# Patient Record
Sex: Male | Born: 1965 | Hispanic: No | Smoking: Never smoker
Health system: Southern US, Community
[De-identification: ages and names within clinical notes are randomized; demographics above are authoritative.]

## PROBLEM LIST (undated history)

## (undated) DIAGNOSIS — N289 Disorder of kidney and ureter, unspecified: Secondary | ICD-10-CM

## (undated) DIAGNOSIS — E119 Type 2 diabetes mellitus without complications: Secondary | ICD-10-CM

## (undated) DIAGNOSIS — E079 Disorder of thyroid, unspecified: Secondary | ICD-10-CM

## (undated) DIAGNOSIS — I509 Heart failure, unspecified: Secondary | ICD-10-CM

## (undated) DIAGNOSIS — Z5189 Encounter for other specified aftercare: Secondary | ICD-10-CM

## (undated) DIAGNOSIS — I10 Essential (primary) hypertension: Secondary | ICD-10-CM

## (undated) DIAGNOSIS — C801 Malignant (primary) neoplasm, unspecified: Secondary | ICD-10-CM

## (undated) HISTORY — PX: ABDOMINAL SURGERY: SHX537

## (undated) HISTORY — PX: EYE SURGERY: SHX253

## (undated) HISTORY — PX: COLON SURGERY: SHX602

---

## 2020-12-14 ENCOUNTER — Inpatient Hospital Stay (HOSPITAL_COMMUNITY)
Admission: EM | Admit: 2020-12-14 | Discharge: 2020-12-17 | DRG: 312 | Disposition: A | Discharge status: Home Health | Payer: Medicare Other | Attending: Internal Medicine | Admitting: Internal Medicine

## 2020-12-14 ENCOUNTER — Emergency Department (HOSPITAL_COMMUNITY): Payer: Medicare Other

## 2020-12-14 ENCOUNTER — Other Ambulatory Visit: Payer: Self-pay

## 2020-12-14 ENCOUNTER — Encounter (HOSPITAL_COMMUNITY): Payer: Self-pay

## 2020-12-14 DIAGNOSIS — E876 Hypokalemia: Secondary | ICD-10-CM | POA: Diagnosis present

## 2020-12-14 DIAGNOSIS — R200 Anesthesia of skin: Secondary | ICD-10-CM

## 2020-12-14 DIAGNOSIS — Z7982 Long term (current) use of aspirin: Secondary | ICD-10-CM

## 2020-12-14 DIAGNOSIS — Z20822 Contact with and (suspected) exposure to covid-19: Secondary | ICD-10-CM | POA: Diagnosis present

## 2020-12-14 DIAGNOSIS — E1140 Type 2 diabetes mellitus with diabetic neuropathy, unspecified: Secondary | ICD-10-CM | POA: Diagnosis present

## 2020-12-14 DIAGNOSIS — I2510 Atherosclerotic heart disease of native coronary artery without angina pectoris: Secondary | ICD-10-CM | POA: Diagnosis present

## 2020-12-14 DIAGNOSIS — E039 Hypothyroidism, unspecified: Secondary | ICD-10-CM | POA: Diagnosis present

## 2020-12-14 DIAGNOSIS — I130 Hypertensive heart and chronic kidney disease with heart failure and stage 1 through stage 4 chronic kidney disease, or unspecified chronic kidney disease: Secondary | ICD-10-CM | POA: Diagnosis present

## 2020-12-14 DIAGNOSIS — E781 Pure hyperglyceridemia: Secondary | ICD-10-CM | POA: Diagnosis present

## 2020-12-14 DIAGNOSIS — N179 Acute kidney failure, unspecified: Secondary | ICD-10-CM

## 2020-12-14 DIAGNOSIS — E1165 Type 2 diabetes mellitus with hyperglycemia: Secondary | ICD-10-CM | POA: Diagnosis present

## 2020-12-14 DIAGNOSIS — E861 Hypovolemia: Secondary | ICD-10-CM | POA: Diagnosis present

## 2020-12-14 DIAGNOSIS — Z955 Presence of coronary angioplasty implant and graft: Secondary | ICD-10-CM

## 2020-12-14 DIAGNOSIS — R251 Tremor, unspecified: Secondary | ICD-10-CM | POA: Diagnosis present

## 2020-12-14 DIAGNOSIS — E1122 Type 2 diabetes mellitus with diabetic chronic kidney disease: Secondary | ICD-10-CM | POA: Diagnosis present

## 2020-12-14 DIAGNOSIS — G459 Transient cerebral ischemic attack, unspecified: Secondary | ICD-10-CM

## 2020-12-14 DIAGNOSIS — I5022 Chronic systolic (congestive) heart failure: Secondary | ICD-10-CM

## 2020-12-14 DIAGNOSIS — Z794 Long term (current) use of insulin: Secondary | ICD-10-CM | POA: Diagnosis not present

## 2020-12-14 DIAGNOSIS — E119 Type 2 diabetes mellitus without complications: Secondary | ICD-10-CM

## 2020-12-14 DIAGNOSIS — K219 Gastro-esophageal reflux disease without esophagitis: Secondary | ICD-10-CM | POA: Diagnosis present

## 2020-12-14 DIAGNOSIS — G8194 Hemiplegia, unspecified affecting left nondominant side: Secondary | ICD-10-CM | POA: Diagnosis present

## 2020-12-14 DIAGNOSIS — Z7902 Long term (current) use of antithrombotics/antiplatelets: Secondary | ICD-10-CM

## 2020-12-14 DIAGNOSIS — R278 Other lack of coordination: Secondary | ICD-10-CM | POA: Diagnosis present

## 2020-12-14 DIAGNOSIS — Z85038 Personal history of other malignant neoplasm of large intestine: Secondary | ICD-10-CM

## 2020-12-14 DIAGNOSIS — D509 Iron deficiency anemia, unspecified: Secondary | ICD-10-CM | POA: Diagnosis present

## 2020-12-14 DIAGNOSIS — E785 Hyperlipidemia, unspecified: Secondary | ICD-10-CM | POA: Diagnosis present

## 2020-12-14 DIAGNOSIS — N183 Chronic kidney disease, stage 3 unspecified: Secondary | ICD-10-CM

## 2020-12-14 DIAGNOSIS — D631 Anemia in chronic kidney disease: Secondary | ICD-10-CM | POA: Diagnosis present

## 2020-12-14 DIAGNOSIS — I951 Orthostatic hypotension: Secondary | ICD-10-CM | POA: Diagnosis present

## 2020-12-14 DIAGNOSIS — G959 Disease of spinal cord, unspecified: Secondary | ICD-10-CM | POA: Diagnosis present

## 2020-12-14 DIAGNOSIS — Z79899 Other long term (current) drug therapy: Secondary | ICD-10-CM

## 2020-12-14 DIAGNOSIS — Z79890 Hormone replacement therapy (postmenopausal): Secondary | ICD-10-CM

## 2020-12-14 DIAGNOSIS — Z8249 Family history of ischemic heart disease and other diseases of the circulatory system: Secondary | ICD-10-CM

## 2020-12-14 DIAGNOSIS — I6389 Other cerebral infarction: Secondary | ICD-10-CM | POA: Diagnosis not present

## 2020-12-14 DIAGNOSIS — R338 Other retention of urine: Secondary | ICD-10-CM | POA: Diagnosis present

## 2020-12-14 DIAGNOSIS — R42 Dizziness and giddiness: Secondary | ICD-10-CM | POA: Diagnosis not present

## 2020-12-14 DIAGNOSIS — N1832 Chronic kidney disease, stage 3b: Secondary | ICD-10-CM | POA: Diagnosis not present

## 2020-12-14 DIAGNOSIS — N401 Enlarged prostate with lower urinary tract symptoms: Secondary | ICD-10-CM | POA: Diagnosis present

## 2020-12-14 DIAGNOSIS — D500 Iron deficiency anemia secondary to blood loss (chronic): Secondary | ICD-10-CM

## 2020-12-14 DIAGNOSIS — I639 Cerebral infarction, unspecified: Secondary | ICD-10-CM | POA: Diagnosis not present

## 2020-12-14 DIAGNOSIS — I255 Ischemic cardiomyopathy: Secondary | ICD-10-CM | POA: Diagnosis present

## 2020-12-14 DIAGNOSIS — N184 Chronic kidney disease, stage 4 (severe): Secondary | ICD-10-CM | POA: Diagnosis present

## 2020-12-14 DIAGNOSIS — I10 Essential (primary) hypertension: Secondary | ICD-10-CM

## 2020-12-14 DIAGNOSIS — Z833 Family history of diabetes mellitus: Secondary | ICD-10-CM

## 2020-12-14 DIAGNOSIS — Z9221 Personal history of antineoplastic chemotherapy: Secondary | ICD-10-CM

## 2020-12-14 HISTORY — DX: Disorder of thyroid, unspecified: E07.9

## 2020-12-14 HISTORY — DX: Essential (primary) hypertension: I10

## 2020-12-14 HISTORY — DX: Heart failure, unspecified: I50.9

## 2020-12-14 HISTORY — DX: Malignant (primary) neoplasm, unspecified: C80.1

## 2020-12-14 HISTORY — DX: Disorder of kidney and ureter, unspecified: N28.9

## 2020-12-14 HISTORY — DX: Type 2 diabetes mellitus without complications: E11.9

## 2020-12-14 HISTORY — DX: Encounter for other specified aftercare: Z51.89

## 2020-12-14 LAB — DIFFERENTIAL
Abs Immature Granulocytes: 0.05 10*3/uL (ref 0.00–0.07)
Basophils Absolute: 0 10*3/uL (ref 0.0–0.1)
Basophils Relative: 0 %
Eosinophils Absolute: 0.1 10*3/uL (ref 0.0–0.5)
Eosinophils Relative: 1 %
Immature Granulocytes: 1 %
Lymphocytes Relative: 8 %
Lymphs Abs: 0.8 10*3/uL (ref 0.7–4.0)
Monocytes Absolute: 0.8 10*3/uL (ref 0.1–1.0)
Monocytes Relative: 8 %
Neutro Abs: 8.3 10*3/uL — ABNORMAL HIGH (ref 1.7–7.7)
Neutrophils Relative %: 82 %

## 2020-12-14 LAB — I-STAT CHEM 8, ED
BUN: 36 mg/dL — ABNORMAL HIGH (ref 6–20)
Calcium, Ion: 1.14 mmol/L — ABNORMAL LOW (ref 1.15–1.40)
Chloride: 93 mmol/L — ABNORMAL LOW (ref 98–111)
Creatinine, Ser: 3.1 mg/dL — ABNORMAL HIGH (ref 0.61–1.24)
Glucose, Bld: 229 mg/dL — ABNORMAL HIGH (ref 70–99)
HCT: 25 % — ABNORMAL LOW (ref 39.0–52.0)
Hemoglobin: 8.5 g/dL — ABNORMAL LOW (ref 13.0–17.0)
Potassium: 3.9 mmol/L (ref 3.5–5.1)
Sodium: 134 mmol/L — ABNORMAL LOW (ref 135–145)
TCO2: 28 mmol/L (ref 22–32)

## 2020-12-14 LAB — COMPREHENSIVE METABOLIC PANEL
ALT: 15 U/L (ref 0–44)
AST: 16 U/L (ref 15–41)
Albumin: 2.7 g/dL — ABNORMAL LOW (ref 3.5–5.0)
Alkaline Phosphatase: 36 U/L — ABNORMAL LOW (ref 38–126)
Anion gap: 10 (ref 5–15)
BUN: 37 mg/dL — ABNORMAL HIGH (ref 6–20)
CO2: 27 mmol/L (ref 22–32)
Calcium: 8.6 mg/dL — ABNORMAL LOW (ref 8.9–10.3)
Chloride: 96 mmol/L — ABNORMAL LOW (ref 98–111)
Creatinine, Ser: 3.23 mg/dL — ABNORMAL HIGH (ref 0.61–1.24)
GFR, Estimated: 22 mL/min — ABNORMAL LOW (ref >60)
Glucose, Bld: 228 mg/dL — ABNORMAL HIGH (ref 70–99)
Potassium: 4 mmol/L (ref 3.5–5.1)
Sodium: 133 mmol/L — ABNORMAL LOW (ref 135–145)
Total Bilirubin: 0.4 mg/dL (ref 0.3–1.2)
Total Protein: 5.6 g/dL — ABNORMAL LOW (ref 6.5–8.1)

## 2020-12-14 LAB — RETICULOCYTES
Immature Retic Fract: 9 % (ref 2.3–15.9)
RBC.: 2.63 MIL/uL — ABNORMAL LOW (ref 4.22–5.81)
Retic Count, Absolute: 69.4 10*3/uL (ref 19.0–186.0)
Retic Ct Pct: 2.6 % (ref 0.4–3.1)

## 2020-12-14 LAB — CBC
HCT: 25 % — ABNORMAL LOW (ref 39.0–52.0)
Hemoglobin: 7.9 g/dL — ABNORMAL LOW (ref 13.0–17.0)
MCH: 29.4 pg (ref 26.0–34.0)
MCHC: 31.6 g/dL (ref 30.0–36.0)
MCV: 92.9 fL (ref 80.0–100.0)
Platelets: 264 10*3/uL (ref 150–400)
RBC: 2.69 MIL/uL — ABNORMAL LOW (ref 4.22–5.81)
RDW: 13.2 % (ref 11.5–15.5)
WBC: 10.1 10*3/uL (ref 4.0–10.5)
nRBC: 0 % (ref 0.0–0.2)

## 2020-12-14 LAB — TSH: TSH: 3.313 u[IU]/mL (ref 0.350–4.500)

## 2020-12-14 LAB — IRON AND TIBC
Iron: 30 ug/dL — ABNORMAL LOW (ref 45–182)
Saturation Ratios: 12 % — ABNORMAL LOW (ref 17.9–39.5)
TIBC: 248 ug/dL — ABNORMAL LOW (ref 250–450)
UIBC: 218 ug/dL

## 2020-12-14 LAB — HIV ANTIBODY (ROUTINE TESTING W REFLEX): HIV Screen 4th Generation wRfx: NONREACTIVE

## 2020-12-14 LAB — CBG MONITORING, ED
Glucose-Capillary: 201 mg/dL — ABNORMAL HIGH (ref 70–99)
Glucose-Capillary: 213 mg/dL — ABNORMAL HIGH (ref 70–99)
Glucose-Capillary: 138 mg/dL — ABNORMAL HIGH (ref 70–99)

## 2020-12-14 LAB — PROTIME-INR
INR: 1.2 (ref 0.8–1.2)
Prothrombin Time: 15.1 seconds (ref 11.4–15.2)

## 2020-12-14 LAB — DIGOXIN LEVEL: Digoxin Level: 2.2 ng/mL — ABNORMAL HIGH (ref 0.8–2.0)

## 2020-12-14 LAB — AMMONIA: Ammonia: 15 umol/L (ref 9–35)

## 2020-12-14 LAB — FERRITIN: Ferritin: 74 ng/mL (ref 24–336)

## 2020-12-14 LAB — APTT: aPTT: 32 seconds (ref 24–36)

## 2020-12-14 MED ORDER — CLOPIDOGREL BISULFATE 75 MG PO TABS
75.0000 mg | ORAL_TABLET | Freq: Every day | ORAL | Status: DC
  Administered 2020-12-14: 75 mg via ORAL
  Filled 2020-12-14: qty 1

## 2020-12-14 MED ORDER — HYDRALAZINE HCL 25 MG PO TABS: 25.0000 mg | ORAL_TABLET | Freq: Four times a day (QID) | ORAL | Status: DC | PRN

## 2020-12-14 MED ORDER — SODIUM CHLORIDE 0.9 % IV SOLN: INTRAVENOUS | Status: DC

## 2020-12-14 MED ORDER — ACETAMINOPHEN 650 MG RE SUPP: 650.0000 mg | RECTAL | Status: DC | PRN

## 2020-12-14 MED ORDER — INSULIN ASPART 100 UNIT/ML ~~LOC~~ SOLN
0.0000 [IU] | Freq: Three times a day (TID) | SUBCUTANEOUS | Status: DC
  Administered 2020-12-14: 3 [IU] via SUBCUTANEOUS

## 2020-12-14 MED ORDER — STROKE: EARLY STAGES OF RECOVERY BOOK
Freq: Once | Status: AC
  Filled 2020-12-14: qty 1

## 2020-12-14 MED ORDER — HEPARIN SODIUM (PORCINE) 5000 UNIT/ML IJ SOLN
5000.0000 [IU] | Freq: Three times a day (TID) | INTRAMUSCULAR | Status: DC
  Administered 2020-12-14 – 2020-12-17 (×7): 5000 [IU] via SUBCUTANEOUS
  Filled 2020-12-14 (×8): qty 1

## 2020-12-14 MED ORDER — TAMSULOSIN HCL 0.4 MG PO CAPS
0.4000 mg | ORAL_CAPSULE | Freq: Every day | ORAL | Status: DC
  Filled 2020-12-14: qty 1

## 2020-12-14 MED ORDER — ACETAMINOPHEN 160 MG/5ML PO SOLN: 650.0000 mg | ORAL | Status: DC | PRN

## 2020-12-14 MED ORDER — ACETAMINOPHEN 325 MG PO TABS: 650.0000 mg | ORAL_TABLET | ORAL | Status: DC | PRN

## 2020-12-14 MED ORDER — LEVOTHYROXINE SODIUM 88 MCG PO TABS
88.0000 ug | ORAL_TABLET | Freq: Every day | ORAL | Status: DC
  Administered 2020-12-15: 88 ug via ORAL
  Filled 2020-12-14: qty 1

## 2020-12-14 MED ORDER — ASPIRIN EC 81 MG PO TBEC
81.0000 mg | DELAYED_RELEASE_TABLET | Freq: Every day | ORAL | Status: DC
  Administered 2020-12-14: 81 mg via ORAL
  Filled 2020-12-14: qty 1

## 2020-12-14 MED ORDER — SENNOSIDES-DOCUSATE SODIUM 8.6-50 MG PO TABS: 1.0000 | ORAL_TABLET | Freq: Every evening | ORAL | Status: DC | PRN

## 2020-12-14 MED ORDER — ATORVASTATIN CALCIUM 80 MG PO TABS
80.0000 mg | ORAL_TABLET | Freq: Every day | ORAL | Status: DC
  Administered 2020-12-14: 80 mg via ORAL
  Filled 2020-12-14: qty 1

## 2020-12-14 MED ORDER — SODIUM CHLORIDE 0.9% FLUSH
3.0000 mL | Freq: Once | INTRAVENOUS | Status: AC
  Administered 2020-12-14: 3 mL via INTRAVENOUS

## 2020-12-14 MED ORDER — INSULIN ASPART 100 UNIT/ML ~~LOC~~ SOLN: 3.0000 [IU] | Freq: Three times a day (TID) | SUBCUTANEOUS | Status: DC

## 2020-12-14 MED ORDER — FERROUS FUMARATE 324 (106 FE) MG PO TABS
1.0000 | ORAL_TABLET | Freq: Every day | ORAL | Status: DC
  Administered 2020-12-15 – 2020-12-17 (×3): 106 mg via ORAL
  Filled 2020-12-14 (×3): qty 1

## 2020-12-14 MED ORDER — PREGABALIN 25 MG PO CAPS: 25.0000 mg | ORAL_CAPSULE | Freq: Every day | ORAL | Status: DC

## 2020-12-14 NOTE — ED Triage Notes (Signed)
Pt BIB [facility] as Code Stroke the patient reoprts her went to urology appointment and returned home at 12pm [facility]) & when he woke up from a nap at 2pm he could not stand, was weak & 3 family members confirmed his speech sounded "off." He has a baseline of neuropathy & tremors with a Hx of cardiac stents, CHF & type II diabetes. Reports he has been out of insulin for about a week, CBG upon arrival was 317. V/S per EMS 106/60, 62 bpm, 16 resp, 97% on RA, 18g PIV in LtAc. A/Ox4 upon arrival to ED, slurred speech.

## 2020-12-14 NOTE — ED Notes (Signed)
Pt states that he feels like he just jumped off the bed.

## 2020-12-14 NOTE — Consult Note (Addendum)
[facility]  INPATIENT CONSULTATION  Reason for Consultation: AKI/CKD Requesting Provider: Dr. Sherry Ruffing at Dr. Montez Hageman request  HPI: [facility] is an 55 y.o. male with CAD h/o PCI, HFrEF 45%, CKD stage 4, HTN, DM, anemia, BPH, h/o colon cancer s/p embolectomy and chemotherapy in 2015,  who is seen at the request of Dr. Sherry Ruffing for elevated creatinine.   Pt from [facility] in [place] recently.  Admitted to [facility] early 11/2020 with weakness, N/V, urinary retention with AKI requiring foley placement.  Cr initially 4 and improved to 2.4-2.5 with fluids and foley.  Appears nearly 1 year ago in [place] Cr was in the 4s.  He sees a nephrologist [facility] and has had vascular surgery consult for AV access for HD but doesn't have it yet.    He was apparently admitted to [facility] last week due to uncontrolled DM.  He came to [facility] today via EMS with R arm weakness and slurred speech after a urology appt he had to have foley removed.  Neurology evaluated and MRI/A being obtained.  CT was normal initially.  He says he hasn't voided yet since foley out but has been having urinary incontinence and wearing diapers for several weeks prior to the 11/2020 admission.     Today Cr 3.2 on presentation. K 4, bicarb 27. Albumin 2.7.  Hb 7.9.  He's on NS 75/hr currently.  He says he's been cleared to eat/drink already. He has altered taste since chemo in 2015 which hasn't changed recently.   PMH: Past Medical History:  Diagnosis Date  . Blood transfusion without reported diagnosis   . Cancer ([place])   . CHF (congestive heart failure) ([place])   . Diabetes mellitus without complication ([place])   . Hypertension   . Renal disorder   . Thyroid disease    PSH: Past Surgical History:  Procedure Laterality Date  . ABDOMINAL SURGERY    . COLON SURGERY    . EYE SURGERY      Past Medical History:  Diagnosis Date  . Blood transfusion without reported diagnosis   . Cancer ([place])   . CHF (congestive heart failure) ([place])    . Diabetes mellitus without complication ([place])   . Hypertension   . Renal disorder   . Thyroid disease     Medications:  I have reviewed the patient's current medications.  (Not in a hospital admission)   ALLERGIES:  Not on File  FAM HX: Family History  Problem Relation Age of Onset  . Cancer Mother   . Diabetes Mother   . Heart failure Mother   . Heart failure Father   . Osteoarthritis Father   . Healthy Sister   . Healthy Brother     Social History:   reports that he has never smoked. He has never used smokeless tobacco. He reports that he does not drink alcohol and does not use drugs.  ROS: 12 system ROS per HPI   Blood pressure 127/72, pulse 66, resp. rate 11, height 5\' 8"  (1.727 m), weight 84.8 kg, SpO2 98 %. PHYSICAL EXAM: Gen: chronically ill but nontoxic appearing  Eyes: EOMI grossly intact, anicteric ENT: MM tacky Neck: supple CV: RRR, no rub Abd: soft, nontender Lungs: clear GU: no foley, bladder not palpable Extr: 2+ edema to mid tibia Neuro: grossly nonfocal, conversant and oriented Skin: cool and dry   Results for orders placed or performed during the hospital encounter of 12/14/20 (from the past 48 hour(s))  CBG monitoring, ED  Status: Abnormal   Collection Time: 12/14/20  2:59 PM  Result Value Ref Range   Glucose-Capillary 201 (H) 70 - 99 mg/dL    Comment: Glucose reference range applies only to samples taken after fasting for at least 8 hours.  Protime-INR     Status: None   Collection Time: 12/14/20  3:02 PM  Result Value Ref Range   Prothrombin Time 15.1 11.4 - 15.2 seconds   INR 1.2 0.8 - 1.2    Comment: (NOTE) INR goal varies based on device and disease states. Performed at [facility], [place] [address] [place], [place] [address]   APTT     Status: None   Collection Time: 12/14/20  3:02 PM  Result Value Ref Range   aPTT 32 24 - 36 seconds    Comment: Performed at [facility] [address] [place], [place]  [address]  CBC     Status: Abnormal   Collection Time: 12/14/20  3:02 PM  Result Value Ref Range   WBC 10.1 4.0 - 10.5 K/uL   RBC 2.69 (L) 4.22 - 5.81 MIL/uL   Hemoglobin 7.9 (L) 13.0 - 17.0 g/dL   HCT 25.0 (L) 39.0 - 52.0 %   MCV 92.9 80.0 - 100.0 fL   MCH 29.4 26.0 - 34.0 pg   MCHC 31.6 30.0 - 36.0 g/dL   RDW 13.2 11.5 - 15.5 %   Platelets 264 150 - 400 K/uL   nRBC 0.0 0.0 - 0.2 %    Comment: Performed at [facility], [place] [address], [place], [place] [address]  Differential     Status: Abnormal   Collection Time: 12/14/20  3:02 PM  Result Value Ref Range   Neutrophils Relative % 82 %   Neutro Abs 8.3 (H) 1.7 - 7.7 K/uL   Lymphocytes Relative 8 %   Lymphs Abs 0.8 0.7 - 4.0 K/uL   Monocytes Relative 8 %   Monocytes Absolute 0.8 0.1 - 1.0 K/uL   Eosinophils Relative 1 %   Eosinophils Absolute 0.1 0.0 - 0.5 K/uL   Basophils Relative 0 %   Basophils Absolute 0.0 0.0 - 0.1 K/uL   Immature Granulocytes 1 %   Abs Immature Granulocytes 0.05 0.00 - 0.07 K/uL    Comment: Performed at [facility] [address], [place], [place] [address]  Comprehensive metabolic panel     Status: Abnormal   Collection Time: 12/14/20  3:02 PM  Result Value Ref Range   Sodium 133 (L) 135 - 145 mmol/L   Potassium 4.0 3.5 - 5.1 mmol/L   Chloride 96 (L) 98 - 111 mmol/L   CO2 27 22 - 32 mmol/L   Glucose, Bld 228 (H) 70 - 99 mg/dL    Comment: Glucose reference range applies only to samples taken after fasting for at least 8 hours.   BUN 37 (H) 6 - 20 mg/dL   Creatinine, Ser 3.23 (H) 0.61 - 1.24 mg/dL   Calcium 8.6 (L) 8.9 - 10.3 mg/dL   Total Protein 5.6 (L) 6.5 - 8.1 g/dL   Albumin 2.7 (L) 3.5 - 5.0 g/dL   AST 16 15 - 41 U/L   ALT 15 0 - 44 U/L   Alkaline Phosphatase 36 (L) 38 - 126 U/L   Total Bilirubin 0.4 0.3 - 1.2 mg/dL   GFR, Estimated 22 (L) >60 mL/min    Comment: (NOTE) Calculated using the CKD-EPI Creatinine Equation (2021)    Anion gap  10 5 - 15    Comment: Performed at  [facility], [place] [address] [place], [place] [address]  I-stat chem 8, ED     Status: Abnormal   Collection Time: 12/14/20  3:11 PM  Result Value Ref Range   Sodium 134 (L) 135 - 145 mmol/L   Potassium 3.9 3.5 - 5.1 mmol/L   Chloride 93 (L) 98 - 111 mmol/L   BUN 36 (H) 6 - 20 mg/dL   Creatinine, Ser 3.10 (H) 0.61 - 1.24 mg/dL   Glucose, Bld 229 (H) 70 - 99 mg/dL    Comment: Glucose reference range applies only to samples taken after fasting for at least 8 hours.   Calcium, Ion 1.14 (L) 1.15 - 1.40 mmol/L   TCO2 28 22 - 32 mmol/L   Hemoglobin 8.5 (L) 13.0 - 17.0 g/dL   HCT 25.0 (L) 39.0 - 52.0 %  CBG monitoring, ED     Status: Abnormal   Collection Time: 12/14/20  3:51 PM  Result Value Ref Range   Glucose-Capillary 213 (H) 70 - 99 mg/dL    Comment: Glucose reference range applies only to samples taken after fasting for at least 8 hours.   Comment 1 Notify RN    Comment 2 Document in Chart   Ammonia     Status: None   Collection Time: 12/14/20  6:36 PM  Result Value Ref Range   Ammonia 15 9 - 35 umol/L    Comment: Performed at [facility], [place] [address] [place] [address], [place], [place] [address]  Digoxin level     Status: Abnormal   Collection Time: 12/14/20  6:36 PM  Result Value Ref Range   Digoxin Level 2.2 (H) 0.8 - 2.0 ng/mL    Comment: Performed at [facility] [address], [place], [place] [address]  Reticulocytes     Status: Abnormal   Collection Time: 12/14/20  6:36 PM  Result Value Ref Range   Retic Ct Pct 2.6 0.4 - 3.1 %   RBC. 2.63 (L) 4.22 - 5.81 MIL/uL   Retic Count, Absolute 69.4 19.0 - 186.0 K/uL   Immature Retic Fract 9.0 2.3 - 15.9 %    Comment: Performed at [facility] [address], [place], [place] [address]    MR ANGIO HEAD WO CONTRAST  Result Date: 12/14/2020 CLINICAL DATA:  Initial evaluation for neuro deficit, stroke suspected. Weakness, speech abnormality. EXAM: MRI HEAD WITHOUT CONTRAST MRA HEAD WITHOUT CONTRAST TECHNIQUE:  Multiplanar, multiecho pulse sequences of the brain and surrounding structures were obtained without intravenous contrast. Angiographic images of the head were obtained using MRA technique without contrast. COMPARISON:  Prior head CT from earlier the same day as well as recent brain MRI from 11/16/2020. FINDINGS: MRI HEAD FINDINGS Brain: Examination degraded by motion artifact. Cerebral volume within normal limits for age. Patchy T2/FLAIR hyperintensity within the periventricular and deep white matter both cerebral hemispheres most consistent with chronic small vessel ischemic disease, mild in nature. No abnormal foci of restricted diffusion to suggest acute or subacute ischemia. Gray-white matter differentiation maintained. No encephalomalacia to suggest chronic cortical infarction. No foci of susceptibility artifact to suggest acute or chronic intracranial hemorrhage. No mass lesion, midline shift or mass effect. No hydrocephalus or extra-axial fluid collection. Pituitary gland suprasellar region normal. Midline structures intact. Vascular: Major intracranial vascular flow voids are maintained. Skull and upper cervical spine: Craniocervical junction within normal limits. Bone marrow signal intensity normal. No scalp soft tissue  abnormality. Sinuses/Orbits: Globes and orbital soft tissues within normal limits. Mild scattered mucosal thickening noted within the ethmoidal air cells and maxillary sinuses. Paranasal sinuses are otherwise largely clear. Trace fluid signal intensity noted within the right mastoid air cells, of doubtful significance. Inner ear structures grossly normal. Other: None. MRA HEAD FINDINGS ANTERIOR CIRCULATION: Examination degraded by motion artifact. Visualized distal cervical segments of the internal carotid arteries are patent with antegrade flow. Petrous, cavernous, and supraclinoid segments patent without stenosis or other abnormality. A1 segments patent bilaterally. Grossly normal  anterior communicating artery complex. Anterior cerebral arteries patent to their distal aspects without stenosis. No M1 stenosis or occlusion. Grossly normal MCA bifurcations. Distal MCA branches well perfused and symmetric. POSTERIOR CIRCULATION: Visualized V4 segments widely patent to the vertebrobasilar junction. Both PICA origins patent and normal. Basilar patent to its distal aspect without stenosis. Superior cerebellar arteries patent bilaterally. Right PCA primarily supplied via the basilar, although a small right posterior communicating arteries noted. Hypoplastic left P1 segment and robust left posterior communicating artery supplies the left PCA. Both PCAs well perfused to their distal aspects. No intracranial aneurysm. IMPRESSION: MRI HEAD IMPRESSION: 1. Motion degraded exam. 2. No acute intracranial infarct or other abnormality. 3. Mild chronic microvascular ischemic disease for age. MRA HEAD IMPRESSION: 1. Technically limited exam due to motion artifact. 2. Grossly negative intracranial MRA. No large vessel occlusion, hemodynamically significant stenosis, or other acute vascular abnormality. Electronically Signed   By: Jeannine Boga M.D.   On: 12/14/2020 18:06   MR BRAIN WO CONTRAST  Result Date: 12/14/2020 CLINICAL DATA:  Initial evaluation for neuro deficit, stroke suspected. Weakness, speech abnormality. EXAM: MRI HEAD WITHOUT CONTRAST MRA HEAD WITHOUT CONTRAST TECHNIQUE: Multiplanar, multiecho pulse sequences of the brain and surrounding structures were obtained without intravenous contrast. Angiographic images of the head were obtained using MRA technique without contrast. COMPARISON:  Prior head CT from earlier the same day as well as recent brain MRI from 11/16/2020. FINDINGS: MRI HEAD FINDINGS Brain: Examination degraded by motion artifact. Cerebral volume within normal limits for age. Patchy T2/FLAIR hyperintensity within the periventricular and deep white matter both cerebral  hemispheres most consistent with chronic small vessel ischemic disease, mild in nature. No abnormal foci of restricted diffusion to suggest acute or subacute ischemia. Gray-white matter differentiation maintained. No encephalomalacia to suggest chronic cortical infarction. No foci of susceptibility artifact to suggest acute or chronic intracranial hemorrhage. No mass lesion, midline shift or mass effect. No hydrocephalus or extra-axial fluid collection. Pituitary gland suprasellar region normal. Midline structures intact. Vascular: Major intracranial vascular flow voids are maintained. Skull and upper cervical spine: Craniocervical junction within normal limits. Bone marrow signal intensity normal. No scalp soft tissue abnormality. Sinuses/Orbits: Globes and orbital soft tissues within normal limits. Mild scattered mucosal thickening noted within the ethmoidal air cells and maxillary sinuses. Paranasal sinuses are otherwise largely clear. Trace fluid signal intensity noted within the right mastoid air cells, of doubtful significance. Inner ear structures grossly normal. Other: None. MRA HEAD FINDINGS ANTERIOR CIRCULATION: Examination degraded by motion artifact. Visualized distal cervical segments of the internal carotid arteries are patent with antegrade flow. Petrous, cavernous, and supraclinoid segments patent without stenosis or other abnormality. A1 segments patent bilaterally. Grossly normal anterior communicating artery complex. Anterior cerebral arteries patent to their distal aspects without stenosis. No M1 stenosis or occlusion. Grossly normal MCA bifurcations. Distal MCA branches well perfused and symmetric. POSTERIOR CIRCULATION: Visualized V4 segments widely patent to the vertebrobasilar junction. Both PICA origins patent and normal.  Basilar patent to its distal aspect without stenosis. Superior cerebellar arteries patent bilaterally. Right PCA primarily supplied via the basilar, although a small right  posterior communicating arteries noted. Hypoplastic left P1 segment and robust left posterior communicating artery supplies the left PCA. Both PCAs well perfused to their distal aspects. No intracranial aneurysm. IMPRESSION: MRI HEAD IMPRESSION: 1. Motion degraded exam. 2. No acute intracranial infarct or other abnormality. 3. Mild chronic microvascular ischemic disease for age. MRA HEAD IMPRESSION: 1. Technically limited exam due to motion artifact. 2. Grossly negative intracranial MRA. No large vessel occlusion, hemodynamically significant stenosis, or other acute vascular abnormality. Electronically Signed   By: Jeannine Boga M.D.   On: 12/14/2020 18:06   CT HEAD CODE STROKE WO CONTRAST  Result Date: 12/14/2020 CLINICAL DATA:  Code stroke.  Left-sided weakness EXAM: CT HEAD WITHOUT CONTRAST TECHNIQUE: Contiguous axial images were obtained from the base of the skull through the vertex without intravenous contrast. COMPARISON:  11/13/2020 FINDINGS: Brain: No acute intracranial hemorrhage, mass effect, or edema. Gray-white differentiation is preserved. There is no extra-axial collection. Ventricles and sulci are stable in size and configuration. Vascular: No hyperdense vessel. There is intracranial atherosclerotic calcification at the skull base. Skull: Unremarkable. Sinuses/Orbits: Polypoid soft tissue within the nasal cavity. Mild paranasal sinus mucosal thickening. Orbits are unremarkable. Other: Mastoid air cells are clear. ASPECTS ([place] Stroke Program Early CT Score) - Ganglionic level infarction (caudate, lentiform nuclei, internal capsule, insula, M1-M3 cortex): 7 - Supraganglionic infarction (M4-M6 cortex): 3 Total score (0-10 with 10 being normal): 10 IMPRESSION: There is no acute intracranial hemorrhage or evidence of acute infarction. ASPECT score is 10. Probable nasal polyposis. These results were communicated to Dr. Cheral Marker at 3:10 pm on 12/14/2020 by text page via the [facility] messaging  system. Electronically Signed   By: Macy Mis M.D.   On: 12/14/2020 15:17    Assessment/Plan **TIA/CVA:  W/u and treatment per neurology. MRI/A pending.  **CKD 4:  Longstanding issue - most likely diabetic nephropathy, notes also implicate cardiorenal.  Renal function stable to improved c/w 08/2020 with nephrology.  He says he is planning to move to [place] eventually but is going to back to [facility] soon and plans to see his nephrologist there.  I won't make nephrology appt for him here yet given this.  I encouraged him to f/u with vascular surgery for AV access too.    **Urinary retention:  Had urinary retention requiring foley during recent admission; it was just removed today - would follow bladder scans here and if retaining would need to have it reinserted.  **Anemia:  Hb 7.9, no indication for transfusion.  Cont po iron rx by his outpt hematologist.  Hb was 9.6 10d ago at [facility].  Consider eval for GIB - defer to primary. Will not start ESA in setting of possible acute CVA.    **BMM:  Phos ok 2 weeks ago,  PTH ok 08/2020. Will check PTH and if > 150 recommend to start calcitriol 0.69mcg MWF.  Corr ca ok.   **HFrEF:  Thought to be mildly volume depleted on admission so receiving NS 75/hr.  He is edematous and taking po intake; I stopped the fluids.  Would resume diuretic tomorrow if back to normal po intake.  Low na diet.  **HTN:  BP controlled.  **DM:  Per primary; has h/o poor control.  He's at baseline renal function and I don't have anything more to add - will sign off.  Please call nephrology back if needed.  Justin Mend 12/14/2020, 7:28 PM

## 2020-12-14 NOTE — ED Notes (Signed)
Patient transported to MRI 

## 2020-12-14 NOTE — H&P (Addendum)
History and Physical    Kent Russell TSV:[id number] DOB: 02-27-1966 DOA: 12/14/2020  PCP: Patient, No Pcp Per (Confirm with patient/family/NH records and if not entered, this has to be entered at [facility] point of entry) Patient coming from: Home  I have personally briefly reviewed patient's old medical records in [place]  Chief Complaint: Feeling weak, dizzy, speech problem  HPI: Kent Russell is a 55 y.o. male with medical history significant of CAD status post stenting, chronic systolic CHF secondary to ischemic cardiomyopathy with EF 45%, CKD stage IV, HTN, IDDM, diabetic neuropathy, hypothyroidism, cervical myelopathy, chronic iron deficiency anemia, hypothyroidism, BPH, presented with multiple complaints.  Patient is from [facility], follow-up with [facility] for his chronic problems. He traveled to this area recently, and was hospitalized at [facility] 2 weeks ago for AKI. And patient also was found to have acute urine retention and Foley was placed in during the admission. Today patient went to the urologist office to have Foley removed.   Patient started to feel bilateral leg weakness and wobbly gait around noontime, associated with significant dizziness, he also felt his speech was slurred " not as fluent as before" and called EMS. Patient denies any chest pain, no shortness of breath no headache no vision changes, no urinary problems.  ED Course: Code stroke was called, CT head negative for acute finding. Blood work showed worsening of anemia Hb 7.9 compared to baseline 9-10. Cre 3.2 compared to baseline 2.4-2.8. Sinus, no hypotension no bradycardia.  Review of Systems: As per HPI otherwise 14 point review of systems negative.    Past Medical History:  Diagnosis Date  . Blood transfusion without reported diagnosis   . Cancer ([place])   . CHF (congestive heart failure) ([place])   . Diabetes mellitus without complication ([place])   . Hypertension   . Renal  disorder   . Thyroid disease     Past Surgical History:  Procedure Laterality Date  . ABDOMINAL SURGERY    . COLON SURGERY    . EYE SURGERY       reports that he has never smoked. He has never used smokeless tobacco. He reports that he does not drink alcohol and does not use drugs.  Not on File  Family History  Problem Relation Age of Onset  . Cancer Mother   . Diabetes Mother   . Heart failure Mother   . Heart failure Father   . Osteoarthritis Father   . Healthy Sister   . Healthy Brother      Prior to Admission medications   Not on File    Physical Exam: Vitals:   12/14/20 1615 12/14/20 1630 12/14/20 1645 12/14/20 1700  BP: 109/77 122/65 126/70 119/64  Pulse: 61 62 63 63  Resp: 18 10    SpO2: 99% 99% 95% 99%  Weight:      Height:        Constitutional: NAD, calm, comfortable Vitals:   12/14/20 1615 12/14/20 1630 12/14/20 1645 12/14/20 1700  BP: 109/77 122/65 126/70 119/64  Pulse: 61 62 63 63  Resp: 18 10    SpO2: 99% 99% 95% 99%  Weight:      Height:       Eyes: PERRL, lids and conjunctivae normal ENMT: Mucous membranes are dry. Posterior pharynx clear of any exudate or lesions.Normal dentition.  Neck: normal, supple, no masses, no thyromegaly Respiratory: clear to auscultation bilaterally, no wheezing, no crackles. Normal respiratory effort. No accessory muscle use.  Cardiovascular: Regular rate and rhythm, no murmurs / rubs / gallops. 2+ extremity edema. 2+ pedal pulses. No carotid bruits.  Abdomen: no tenderness, no masses palpated. No hepatosplenomegaly. Bowel sounds positive.  Musculoskeletal: no clubbing / cyanosis. No joint deformity upper and lower extremities. Good ROM, no contractures. Normal muscle tone. Tremors noticed on B/L finger tips Skin: no rashes, lesions, ulcers. No induration Neurologic: No facial droop, muscle strength symmetrical 5/5 bilaterally, coordination poor on bilateral lower extremities, Babinski signs negative. Psychiatric:  Normal judgment and insight. Alert and oriented x 3. Normal mood.     Labs on Admission: I have personally reviewed following labs and imaging studies  CBC: Recent Labs  Lab 12/14/20 1502 12/14/20 1511  WBC 10.1  --   NEUTROABS 8.3*  --   HGB 7.9* 8.5*  HCT 25.0* 25.0*  MCV 92.9  --   PLT 264  --    Basic Metabolic Panel: Recent Labs  Lab 12/14/20 1502 12/14/20 1511  NA 133* 134*  K 4.0 3.9  CL 96* 93*  CO2 27  --   GLUCOSE 228* 229*  BUN 37* 36*  CREATININE 3.23* 3.10*  CALCIUM 8.6*  --    GFR: Estimated Creatinine Clearance: 28.6 mL/min (A) (by C-G formula based on SCr of 3.1 mg/dL (H)). Liver Function Tests: Recent Labs  Lab 12/14/20 1502  AST 16  ALT 15  ALKPHOS 36*  BILITOT 0.4  PROT 5.6*  ALBUMIN 2.7*   No results for input(s): LIPASE, AMYLASE in the last 168 hours. No results for input(s): AMMONIA in the last 168 hours. Coagulation Profile: Recent Labs  Lab 12/14/20 1502  INR 1.2   Cardiac Enzymes: No results for input(s): CKTOTAL, CKMB, CKMBINDEX, TROPONINI in the last 168 hours. BNP (last 3 results) No results for input(s): PROBNP in the last 8760 hours. HbA1C: No results for input(s): HGBA1C in the last 72 hours. CBG: Recent Labs  Lab 12/14/20 1459 12/14/20 1551  GLUCAP 201* 213*   Lipid Profile: No results for input(s): CHOL, HDL, LDLCALC, TRIG, CHOLHDL, LDLDIRECT in the last 72 hours. Thyroid Function Tests: No results for input(s): TSH, T4TOTAL, FREET4, T3FREE, THYROIDAB in the last 72 hours. Anemia Panel: No results for input(s): VITAMINB12, FOLATE, FERRITIN, TIBC, IRON, RETICCTPCT in the last 72 hours. Urine analysis: No results found for: COLORURINE, APPEARANCEUR, LABSPEC, [place], GLUCOSEU, [place], BILIRUBINUR, KETONESUR, PROTEINUR, UROBILINOGEN, NITRITE, LEUKOCYTESUR  Radiological Exams on Admission: CT HEAD CODE STROKE WO CONTRAST  Result Date: 12/14/2020 CLINICAL DATA:  Code stroke.  Left-sided weakness EXAM: CT HEAD  WITHOUT CONTRAST TECHNIQUE: Contiguous axial images were obtained from the base of the skull through the vertex without intravenous contrast. COMPARISON:  11/13/2020 FINDINGS: Brain: No acute intracranial hemorrhage, mass effect, or edema. Gray-white differentiation is preserved. There is no extra-axial collection. Ventricles and sulci are stable in size and configuration. Vascular: No hyperdense vessel. There is intracranial atherosclerotic calcification at the skull base. Skull: Unremarkable. Sinuses/Orbits: Polypoid soft tissue within the nasal cavity. Mild paranasal sinus mucosal thickening. Orbits are unremarkable. Other: Mastoid air cells are clear. ASPECTS ([place] Stroke Program Early CT Score) - Ganglionic level infarction (caudate, lentiform nuclei, internal capsule, insula, M1-M3 cortex): 7 - Supraganglionic infarction (M4-M6 cortex): 3 Total score (0-10 with 10 being normal): 10 IMPRESSION: There is no acute intracranial hemorrhage or evidence of acute infarction. ASPECT score is 10. Probable nasal polyposis. These results were communicated to Dr. Cheral Marker at 3:10 pm on 12/14/2020 by text page via the [facility] messaging system. Electronically Signed  By: Macy Mis M.D.   On: 12/14/2020 15:17    EKG: Independently reviewed. Sinus (baseline appears to have artifact from tremors)  Assessment/Plan Active Problems:   TIA (transient ischemic attack)   CVA (cerebral vascular accident) ([place])  (please populate well all problems here in Problem List. (For example, if patient is on BP meds at home and you resume or decide to hold them, it is a problem that needs to be her. Same for CAD, COPD, HLD and so on)  TIA -He has new onset symptoms of slurred speech and ataxia, along with nystagmus and dizziness, concerning about posterior CVA. -MRI pending  AKI on CKD -Signs of hypovolemia -Hold Bumex -Normal saline at 75 mL/h x 12 hours -Nephro consult in ED.  Chronic systolic CHF secondary to  ischemic cardiomyopathy -Signs of hypovolemia with AKI -Slow hydration for 12 hours then re-evaluate -Check digoxin level, hold digoxin for now given worsening of kidney function  Tremors -No clear etiology -Check TSH. -Check UDS  Chronic iron deficiency anemia -Check iron level -Check reticulocyte count  HTN -Permissive hypertension, hold Bumex, metoprolol, losartan and Aldactone -Urine hydralazine  IDDM with hyperglycemia -NovoLog 3 units 3 times daily AC plus sliding scale  History of cervical myelopathy - [facility] record: MRI on 12/06/2021shows "Subtle convex left thoracic scoliosis. Cervical kyphosis centered at C4-5 with subtle retrolisthesis of C4 on C5 and C5 on C6. Severe C5-6, moderate-severe C4-5 disc disease. Moderate multilevel thoracic disc disease. No significant lumbar disc disease. Vertebral body heights are preserved." -If MRI brain negative, and patient continues to experience dizziness and ataxia, consider MRI cervical spine  BPH -On Flomax -Check UA -Urinary symptoms probably from indwelling Foley, which was pulled out this morning.  DM neuropathy -Cut down Lyrica dosage  Hypothyroidism -Continue Synthroid  CAD -Continue ASA and Plavix  DVT prophylaxis: Heparin subcu  code Status: Full code Family Communication: None at bedside Disposition Plan: Expect more than 2 midnight hospital stay, for CVA work up and PT evaluation, may need rehab Consults called: Neurology, nephrology Admission status: Tele admit   Lequita Halt MD [facility] Pager [phone]  12/14/2020, 5:50 PM

## 2020-12-14 NOTE — ED Notes (Signed)
Dr. Earnestine Leys at bedside.

## 2020-12-14 NOTE — Code Documentation (Signed)
Stroke Response Nurse Documentation Code Documentation  Kent Russell is a 55 y.o. male arriving to [place] ED via [facility] on 12/14/2020 with past medical hx of diabetes. Code stroke was activated by EMS. Patient from home where he was LKW at 1200 when he came home after having his foley removed. Patient was recently at [facility] with uncontrolled diabetes and was discharged one week ago and now complaining of slurred speech and right arm weakness  Stroke team at the bedside on patient arrival. Labs drawn and patient cleared for CT by Dr. Sherry Ruffing. Patient to CT with team. NIHSS 2, see documentation for details and code stroke times. Patient with right leg weakness and right limb ataxia on exam. The following imaging was completed:  CT. Patient is not a candidate for tPA due to being too mild to treat and stroke not suspected by MD. Patient remains in the tPA window until 1630. Complete q30 VS/mNIHSS until outside window and then complete q2 x 12 hours. Bedside handoff with ED RN Arby Barrette.    Kathrin Greathouse  Stroke Response RN

## 2020-12-14 NOTE — ED Notes (Signed)
Pt states that his head is feeling "like its coming off the bead & heavy" plus his right leg is feeling "heavier" as well, EDP Tegeler, MD asked to reassess pt at bedside. NIH for this RN remains 2 upon assessment.

## 2020-12-14 NOTE — ED Provider Notes (Signed)
[name] EMERGENCY DEPARTMENT Provider Note   CSN: [id number] Arrival date & time: 12/14/20  1456     History Chief Complaint  Patient presents with  . Code Stroke    Kent Russell is a 55 y.o. male.  The history is provided by the patient, medical records and the EMS personnel. No language interpreter was used.  Neurologic Problem This is a new problem. The current episode started 1 to 2 hours ago. The problem occurs constantly. The problem has not changed since onset.Pertinent negatives include no chest pain, no abdominal pain, no headaches and no shortness of breath. Nothing aggravates the symptoms. Nothing relieves the symptoms. He has tried nothing for the symptoms. The treatment provided no relief.       No past medical history on file.  There are no problems to display for this patient.   Past Surgical History:  Procedure Laterality Date  . ABDOMINAL SURGERY    . COLON SURGERY    . EYE SURGERY         No family history on file.     Home Medications Prior to Admission medications   Not on File    Allergies    Patient has no allergy information on record.  Review of Systems   Review of Systems  Constitutional: Negative for chills, diaphoresis, fatigue and fever.  HENT: Negative for congestion.   Eyes: Negative for visual disturbance.  Respiratory: Negative for cough, chest tightness, shortness of breath and wheezing.   Cardiovascular: Negative for chest pain, palpitations and leg swelling.  Gastrointestinal: Positive for nausea. Negative for abdominal pain, constipation, diarrhea and vomiting.  Genitourinary: Negative for flank pain.  Musculoskeletal: Positive for gait problem. Negative for back pain.  Skin: Negative for wound.  Neurological: Positive for dizziness and numbness. Negative for syncope, weakness, light-headedness and headaches.  Psychiatric/Behavioral: Negative for agitation.  All other systems reviewed and are  negative.   Physical Exam Updated Vital Signs Wt 84.8 kg   Physical Exam Vitals and nursing note reviewed.  Constitutional:      General: He is not in acute distress.    Appearance: He is well-developed and well-nourished. He is not ill-appearing, toxic-appearing or diaphoretic.  HENT:     Head: Normocephalic and atraumatic.     Nose: Nose normal. No congestion or rhinorrhea.     Mouth/Throat:     Mouth: Mucous membranes are moist.     Pharynx: No oropharyngeal exudate or posterior oropharyngeal erythema.  Eyes:     Extraocular Movements: Extraocular movements intact.     Conjunctiva/sclera: Conjunctivae normal.     Pupils: Pupils are equal, round, and reactive to light.  Cardiovascular:     Rate and Rhythm: Normal rate and regular rhythm.     Heart sounds: No murmur heard.   Pulmonary:     Effort: Pulmonary effort is normal. No respiratory distress.     Breath sounds: Normal breath sounds.  Abdominal:     Palpations: Abdomen is soft.     Tenderness: There is no abdominal tenderness. There is no right CVA tenderness, left CVA tenderness, guarding or rebound.  Musculoskeletal:        General: No tenderness, signs of injury or edema.     Cervical back: Neck supple. No tenderness.     Right lower leg: No edema.     Left lower leg: No edema.  Skin:    General: Skin is warm and dry.     Capillary Refill: Capillary refill  takes less than 2 seconds.     Findings: No erythema or rash.  Neurological:     Mental Status: He is alert.     GCS: GCS eye subscore is 4. GCS verbal subscore is 5. GCS motor subscore is 6.     Cranial Nerves: No dysarthria or facial asymmetry.     Sensory: Sensory deficit present.     Motor: No weakness, tremor or seizure activity.     Coordination: Coordination abnormal. Heel to Shin Test abnormal. Finger-Nose-Finger Test normal.     Comments: Patient has ataxia in right leg compared to left.  He also reports numbness in his right leg compared to left.   He reports persistent dizziness but pupil exam is unremarkable.  Normal grip strength and sensation in arms.  Normal finger-nose-finger testing.  Psychiatric:        Mood and Affect: Mood and affect and mood normal.     ED Results / Procedures / Treatments   Labs (all labs ordered are listed, but only abnormal results are displayed) Labs Reviewed  CBC - Abnormal; Notable for the following components:      Result Value   RBC 2.69 (*)    Hemoglobin 7.9 (*)    HCT 25.0 (*)    All other components within normal limits  DIFFERENTIAL - Abnormal; Notable for the following components:   Neutro Abs 8.3 (*)    All other components within normal limits  COMPREHENSIVE METABOLIC PANEL - Abnormal; Notable for the following components:   Sodium 133 (*)    Chloride 96 (*)    Glucose, Bld 228 (*)    BUN 37 (*)    Creatinine, Ser 3.23 (*)    Calcium 8.6 (*)    Total Protein 5.6 (*)    Albumin 2.7 (*)    Alkaline Phosphatase 36 (*)    GFR, Estimated 22 (*)    All other components within normal limits  CBG MONITORING, ED - Abnormal; Notable for the following components:   Glucose-Capillary 201 (*)    All other components within normal limits  I-STAT CHEM 8, ED - Abnormal; Notable for the following components:   Sodium 134 (*)    Chloride 93 (*)    BUN 36 (*)    Creatinine, Ser 3.10 (*)    Glucose, Bld 229 (*)    Calcium, Ion 1.14 (*)    Hemoglobin 8.5 (*)    HCT 25.0 (*)    All other components within normal limits  CBG MONITORING, ED - Abnormal; Notable for the following components:   Glucose-Capillary 213 (*)    All other components within normal limits  PROTIME-INR  APTT    EKG EKG Interpretation  Date/Time:  Thursday December 14 2020 15:50:39 EST Ventricular Rate:  59 PR Interval:    QRS Duration: 118 QT Interval:  435 QTC Calculation: 431 R Axis:   27 Text Interpretation: Sinus rhythm Nonspecific intraventricular conduction delay Inferior infarct, old Borderline ST  elevation, anterior leads No prior ECG for comparison. No STEMI Confirmed by Antony Blackbird [phone]) on 12/14/2020 4:10:40 PM   Radiology CT HEAD CODE STROKE WO CONTRAST  Result Date: 12/14/2020 CLINICAL DATA:  Code stroke.  Left-sided weakness EXAM: CT HEAD WITHOUT CONTRAST TECHNIQUE: Contiguous axial images were obtained from the base of the skull through the vertex without intravenous contrast. COMPARISON:  11/13/2020 FINDINGS: Brain: No acute intracranial hemorrhage, mass effect, or edema. Gray-white differentiation is preserved. There is no extra-axial collection. Ventricles and sulci are  stable in size and configuration. Vascular: No hyperdense vessel. There is intracranial atherosclerotic calcification at the skull base. Skull: Unremarkable. Sinuses/Orbits: Polypoid soft tissue within the nasal cavity. Mild paranasal sinus mucosal thickening. Orbits are unremarkable. Other: Mastoid air cells are clear. ASPECTS ([place] Stroke Program Early CT Score) - Ganglionic level infarction (caudate, lentiform nuclei, internal capsule, insula, M1-M3 cortex): 7 - Supraganglionic infarction (M4-M6 cortex): 3 Total score (0-10 with 10 being normal): 10 IMPRESSION: There is no acute intracranial hemorrhage or evidence of acute infarction. ASPECT score is 10. Probable nasal polyposis. These results were communicated to Dr. Cheral Marker at 3:10 pm on 12/14/2020 by text page via the [facility] messaging system. Electronically Signed   By: Macy Mis M.D.   On: 12/14/2020 15:17    Procedures Procedures   Medications Ordered in ED Medications  sodium chloride flush (NS) 0.9 % injection 3 mL (3 mLs Intravenous Given 12/14/20 1542)    ED Course  I have reviewed the triage vital signs and the nursing notes.  Pertinent labs & imaging results that were available during my care of the patient were reviewed by me and considered in my medical decision making (see chart for details).    MDM Rules/Calculators/A&P                           Man Effertz is a 55 y.o. male with a past medical history significant for hypertension, diabetes, CHF, CAD with PCI, thyroid disease, and some form of cancer who presents as a code stroke.  According to EMS, patient was last normal at noon.  Patient reports that he woke up from a nap at 2 and was unable to walk with his right leg.  He says that he has been having on and off speech difficulties for the last 2 weeks but it also had worsened at noon.  He said he has some baseline neuropathy in his legs from his diabetes.  He reports that he lives in [place] and is visiting family.  I evaluate the patient's airway on arrival to emergency department and he was speaking clearly.  Airway is intact.  Patient with the CT scanner which did not show acute bleed or acute stroke on CT.  On my exam, patient does have ataxia with his right leg as well as numbness in the right leg.  He had clear speech for me.  Patient's lungs were clear and chest abdomen nontender.  He had symmetric grip strength and normal finger-nose-finger testing on my initial exam.  He had good strength in both legs.  Neurology feels patient needs admission and we will call for admit.  Patient had acute worsening of symptoms at around 4:10 PM.  I called the neurology team who is coming to the patient right now to reassess to see if any further management changes need to be made.   Final Clinical Impression(s) / ED Diagnoses Final diagnoses:  Numbness  Dizziness     Clinical Impression: 1. Numbness   2. Dizziness     Disposition: Admit  This note was prepared with assistance of Dragon voice recognition software. Occasional wrong-word or sound-a-like substitutions may have occurred due to the inherent limitations of voice recognition software.     Travin Marik, Gwenyth Allegra, MD 12/14/20 [phone]

## 2020-12-14 NOTE — ED Notes (Signed)
EDP Tegeler at bedside 

## 2020-12-14 NOTE — ED Notes (Signed)
CHECKED PATIENT CBG IT WAS 201

## 2020-12-14 NOTE — Consult Note (Addendum)
Neurology Consultation  CC: CODE STROKE  History is obtained from: Patient, EMS, and chart  HPI: Kent Russell is a 55 y.o. male with a PMHx of CAD s/p stent on Plavix, IDDM, CKD IV, hypothyroidism, colon cancer s/p chemotherapy, and CHF. He has not been to [facility] before. He does not smoke. No personal or FMHx of stroke. He presents today via EMS to ED as a code stroke. Per EMS, patient was normal this a.m. and was able to run errands and go to urology office to have his Foley removed, which was placed 10 days ago at [facility] for urinary retention. Shortly after 12 noon, he began to c/o dizziness and was unable to support himself on right leg. EMS had to assist him with ambulation. Also, he felt his speech was slurred as did his family who called 911.   He has been out of insulin for 2 days. CBG in field was 317. BP 90/60 improved to 106/62 in the field.   After brief assessment in ED, patient was taken urgently to CT. No dysphagia, remains a little dizzy, feels weak on the right, and thinks his speech is slightly slurred.   In review of chart note from urology today, his home medications include: Lipitor, Vit C, Bumex, Digoxin, Trulicity, iron, Lantus, Humalog, Synthroid, Cozaar, Metoprolol, Protonix, Lyrica, Spironolactone, Vit C, Melatonin, and Flomax.    LKW: 12 noon  tpa given?: Relatively mild deficits with low NIHSS. Overall risks are felt to outweigh potential benefits. After discussion with patient, he expressed understanding and agreement with the plan.  IR Thrombectomy: No, overall presentation is not consistent with LVO mRS-1  NIHSS:  1a Level of Conscious: 0 1b LOC Questions: 0 1c LOC Commands: 0 2 Best Gaze: 0 3 Visual: 0 4 Facial Palsy: 0 5a Motor Arm - left: 0 5b Motor Arm - Right: 0 6a Motor Leg - Left: 0 6b Motor Leg - Right: 1 7 Limb Ataxia: 1 8 Sensory: 0 9 Best Language: 0 10 Dysarthria: 0 11 Extinct. and Inatten: 0 TOTAL: 2   ROS: A complete ROS was  performed and is negative except as noted in the HPI.    PMHx: Colon cancer s/p chemotherapy, HTN, CAD s/p stent, CHF, urinary retention,  IDDM, hypothyroidism, HLD, GERD  Family Medical Hx: No strokes. + CAD, DM, CHF, and cancer.   Social History: Denies smoking, ETOH use, illicit drug use. Engaged.   Home medications: No current facility-administered medications on file prior to encounter.   Current Outpatient Medications on File Prior to Encounter  Medication Sig Dispense Refill  . ascorbic acid (VITAMIN C) 250 MG tablet Take 250 mg by mouth daily.    Marland Kitchen aspirin 81 MG EC tablet Take 81 mg by mouth daily.    Marland Kitchen atorvastatin (LIPITOR) 80 MG tablet Take 80 mg by mouth daily.    . bisacodyl (DULCOLAX) 5 MG EC tablet Take 5 mg by mouth daily.    . bumetanide (BUMEX) 2 MG tablet Take 2 mg by mouth 2 (two) times daily.    . calcium-vitamin D (OSCAL WITH D) 500-200 MG-UNIT tablet Take 1 tablet by mouth daily with breakfast.    . capsaicin (ZOSTRIX) 0.025 % cream Apply 1 application topically 3 (three) times daily as needed for pain.    Marland Kitchen clopidogrel (PLAVIX) 75 MG tablet Take 75 mg by mouth daily.    . digoxin (LANOXIN) 0.125 MG tablet Take 125 mcg by mouth daily.    . DULoxetine (CYMBALTA)  30 MG capsule Take 30 mg by mouth daily.    . ferrous sulfate 325 (65 FE) MG EC tablet Take 325 mg by mouth daily.    Marland Kitchen HUMALOG KWIKPEN 100 UNIT/ML KwikPen Inject 2-10 Units into the skin in the morning and at bedtime. Sliding scales    . icosapent Ethyl (VASCEPA) 1 g capsule Take 2 g by mouth 2 (two) times daily.    . insulin glargine (LANTUS SOLOSTAR) 100 UNIT/ML Solostar Pen Inject 25 Units into the skin 2 (two) times daily.    . isosorbide mononitrate (IMDUR) 30 MG 24 hr tablet Take 30 mg by mouth daily.    Marland Kitchen lactulose (CHRONULAC) 10 GM/15ML solution Take 10 g by mouth daily as needed for mild constipation.    Marland Kitchen levothyroxine (SYNTHROID) 88 MCG tablet Take 88 mcg by mouth daily.    Marland Kitchen losartan (COZAAR)  50 MG tablet Take 50 mg by mouth daily.    . melatonin 3 MG TABS tablet Take 3 mg by mouth at bedtime as needed for sleep.    . metoprolol (TOPROL-XL) 200 MG 24 hr tablet Take 200 mg by mouth 2 (two) times daily.    . Multiple Vitamin (QUINTABS) TABS Take 1 tablet by mouth daily.    . nitroGLYCERIN (NITROSTAT) 0.4 MG SL tablet Place 0.4 mg under the tongue every 5 (five) minutes as needed for chest pain (max 3 doses).    . pantoprazole (PROTONIX) 40 MG tablet Take 40 mg by mouth daily.    . polyethylene glycol powder (GLYCOLAX/MIRALAX) 17 GM/SCOOP powder Take 17 g by mouth daily as needed for constipation.    . pregabalin (LYRICA) 50 MG capsule Take 50-150 mg by mouth See admin instructions. Takes 1 capsule (150 mg) by mouth in the morning; takes 1 capsule (50 mg) by mouth at noon; then takes 1 capsules (150 mg) by mouth at bedtime    . ranolazine (RANEXA) 1000 MG SR tablet Take 1,000 mg by mouth 2 (two) times daily.    Marland Kitchen spironolactone (ALDACTONE) 25 MG tablet Take 25 mg by mouth daily.    . tamsulosin (FLOMAX) 0.4 MG CAPS capsule Take 0.4 mg by mouth daily.    . TRULICITY 1.5 JK/0.9FG SOPN Inject 1.5 mg into the skin every Saturday.    . zolpidem (AMBIEN) 10 MG tablet Take 10 mg by mouth at bedtime as needed for sleep.           Exam: Current vital signs: Wt 84.8 kg  SaO2 97% RA BP 106/62 in field.   Physical Exam  Constitutional: Appears well-developed and well-nourished.  Psych: Affect appropriate to situation Eyes: No scleral injection HENT: No OP obstruction. Oral mucosa appear somewhat dry.  Head: Normocephalic.  Cardiovascular: RRR Respiratory: Effort normal.  GI: Soft.  Skin: WDI  Neuro: Mental Status: Patient is awake, alert, oriented to person, place, month, year, and situation. Patient is able to give a clear and coherent history. No signs of aphasia or neglect. Speech/Language: Speech is intact and fluent. No dysarthria Comprehension, repetition, and naming intact.   Cranial Nerves: II: Visual Fields are full. Pupils are equal, round, and reactive to light. III,IV, VI: EOMI without ptosis or diplopia.  V: Facial sensation is symmetric to light touch. Able to move jaw back and forth.  VII: Smile is symmetrical. Able to puff cheeks and raise eyebrows.  VIII: hearing is intact to voice IX, X: Palate elevates symmetrically. Phonation normal.  XI: Shoulder shrug is symmetric. XII: Tongue is midline  without atrophy or fasciculations.  Motor: Tone is normal. Bulk is normal. 4/5 strength to RUE/RLE. 4-/5 to LUE and LLE with exception of left foot dorsiflexion 3/5. Sensory: Sensation is symmetric to light touch and temperature in the arms and legs proximally. No extinction with DSS. Stocking distribution sensory loss is noted.   Deep Tendon Reflexes: 1+ and symmetric in the biceps. 0 patellae. Plantars: Toes are downgoing bilaterally. Cerebellar: FNF intact bilaterally. HSK is normal in LLE, ataxic in RLE. Tremor noted when lifting RLE. Drift present in RLE. There was a suggestion of possible asterixis on initial exam as the explanation for the coarse tremor. On follow up exam 1 hour after evaluation in CT, the patient exhibited more prominent asterixis of his upper and lower extremities (negative myoclonus).  Gait: Unable to assess.   I have reviewed labs in epic and the pertinent results are: INR   1.2  NCT head There is no acute intracranial hemorrhage or evidence of acute infarction. ASPECT score is 10.  Assessment: 55 yo male who presents to ED via EMS today as a code stroke. LKW 12 noon. He was able to run errands and visit his MD in the morning. Shortly after noon, he became dizzy, felt his left side to be weak and had slurred speech. He was unable to ambulate without assistance. On arrival, his slurred speech had resolved but still with mild left sided weakness.  - CT head negative for acute infarct or hemorrhage. No tPA was given due to risks  outweighing benefits.  - His CBG was high.  - Worsening asterixis between initial and follow up exams in the ED.  - Renal impairment with elevated BUN and Cr. The patient states that an MD in [place] who evaluated him about 3 months ago was considering placement of a fistula initiation of dialysis.  - DDx for the patient's presentation includes acute stroke and uremia. Seizure is unlikely.  - Questionable compliance with home medications.   Recommendations: - MRI brain without contrast. - MRA head and neck. - TTE. - Recommend labs: HbA1c, lipid panel, TSH. - Recommend statin or increased dose if LDL > 70 - Continue aspirin 81 mg daily. - Continue clopidogrel 75 mg daily.   - Tight glucose control.  - SBP goal - Permissive hypertension first 24-48 hrs with goal < 220/110, then normalize.       Hold home medications  for now. - Telemetry monitoring for arrhythmia. - Bedside Swallow screen. - Stroke education. - PT/OT/SLP consult. - Recommend metabolic/infectious workup with CBC, CMP, UA with UCx, CXR, CK, serum lactate - Nephrology consult to assess for possible new onset uremia as the etiology for the patient's asterixis, as well as possible need for dialysis.   Pt seen by Clance Boll, NP  Pager: [phone]   I have seen and examined the patient. My exam findings were observed and documented by Clance Boll, NP Electronically signed: Dr. Kerney Elbe

## 2020-12-14 NOTE — H&P (Incomplete Revision)
History and Physical    Kent Russell MLY:[id number] DOB: [birth year] DOA: 12/14/2020  PCP: Patient, No Pcp Per (Confirm with patient/family/NH records and if not entered, this has to be entered at [facility] point of entry) Patient coming from: Home  I have personally briefly reviewed patient's old medical records in [place]  Chief Complaint: Feeling weak, dizzy, speech problem  HPI: Kent Russell is a 55 y.o. male with medical history significant of CAD status post stenting, chronic systolic CHF secondary to ischemic cardiomyopathy with EF 45%, CKD stage IV, HTN, IDDM, diabetic neuropathy, hypothyroidism, cervical myelopathy, chronic iron deficiency anemia, hypothyroidism, BPH, presented with multiple complaints.  Patient is from [facility], follow-up with [facility] for his chronic problems. He traveled to this area recently, and was hospitalized at [facility] 2 weeks ago for AKI. And patient also was found to have acute urine retention and Foley was placed in during the admission. Today patient went to the urologist office to have Foley removed.   Patient started to feel bilateral leg weakness and wobbly gait around noontime, associated with significant dizziness, he also felt his speech was slurred " not as fluent as before" and called EMS. Patient denies any chest pain, no shortness of breath no headache no vision changes, no urinary problems.  ED Course: Code stroke was called, CT head negative for acute finding. Blood work showed worsening of anemia Hb 7.9 compared to baseline 9-10. Cre 3.2 compared to baseline 2.4-2.8. Sinus, no hypotension no bradycardia.  Review of Systems: As per HPI otherwise 14 point review of systems negative.    Past Medical History:  Diagnosis Date  . Blood transfusion without reported diagnosis   . Cancer ([place])   . CHF (congestive heart failure) ([place])   . Diabetes mellitus without complication ([place])   . Hypertension   . Renal  disorder   . Thyroid disease     Past Surgical History:  Procedure Laterality Date  . ABDOMINAL SURGERY    . COLON SURGERY    . EYE SURGERY       reports that he has never smoked. He has never used smokeless tobacco. He reports that he does not drink alcohol and does not use drugs.  Not on File  Family History  Problem Relation Age of Onset  . Cancer Mother   . Diabetes Mother   . Heart failure Mother   . Heart failure Father   . Osteoarthritis Father   . Healthy Sister   . Healthy Brother      Prior to Admission medications   Not on File    Physical Exam: Vitals:   12/14/20 1615 12/14/20 1630 12/14/20 1645 12/14/20 1700  BP: 109/77 122/65 126/70 119/64  Pulse: 61 62 63 63  Resp: 18 10    SpO2: 99% 99% 95% 99%  Weight:      Height:        Constitutional: NAD, calm, comfortable Vitals:   12/14/20 1615 12/14/20 1630 12/14/20 1645 12/14/20 1700  BP: 109/77 122/65 126/70 119/64  Pulse: 61 62 63 63  Resp: 18 10    SpO2: 99% 99% 95% 99%  Weight:      Height:       Eyes: PERRL, lids and conjunctivae normal ENMT: Mucous membranes are dry. Posterior pharynx clear of any exudate or lesions.Normal dentition.  Neck: normal, supple, no masses, no thyromegaly Respiratory: clear to auscultation bilaterally, no wheezing, no crackles. Normal respiratory effort. No accessory muscle use.  Cardiovascular: Regular rate and rhythm, no murmurs / rubs / gallops. 2+ extremity edema. 2+ pedal pulses. No carotid bruits.  Abdomen: no tenderness, no masses palpated. No hepatosplenomegaly. Bowel sounds positive.  Musculoskeletal: no clubbing / cyanosis. No joint deformity upper and lower extremities. Good ROM, no contractures. Normal muscle tone. Tremors noticed on B/L finger tips Skin: no rashes, lesions, ulcers. No induration Neurologic: No facial droop, muscle strength symmetrical 5/5 bilaterally, coordination poor on bilateral lower extremities, Babinski signs negative. Psychiatric:  Normal judgment and insight. Alert and oriented x 3. Normal mood.     Labs on Admission: I have personally reviewed following labs and imaging studies  CBC: Recent Labs  Lab 12/14/20 1502 12/14/20 1511  WBC 10.1  --   NEUTROABS 8.3*  --   HGB 7.9* 8.5*  HCT 25.0* 25.0*  MCV 92.9  --   PLT 264  --    Basic Metabolic Panel: Recent Labs  Lab 12/14/20 1502 12/14/20 1511  NA 133* 134*  K 4.0 3.9  CL 96* 93*  CO2 27  --   GLUCOSE 228* 229*  BUN 37* 36*  CREATININE 3.23* 3.10*  CALCIUM 8.6*  --    GFR: Estimated Creatinine Clearance: 28.6 mL/min (A) (by C-G formula based on SCr of 3.1 mg/dL (H)). Liver Function Tests: Recent Labs  Lab 12/14/20 1502  AST 16  ALT 15  ALKPHOS 36*  BILITOT 0.4  PROT 5.6*  ALBUMIN 2.7*   No results for input(s): LIPASE, AMYLASE in the last 168 hours. No results for input(s): AMMONIA in the last 168 hours. Coagulation Profile: Recent Labs  Lab 12/14/20 1502  INR 1.2   Cardiac Enzymes: No results for input(s): CKTOTAL, CKMB, CKMBINDEX, TROPONINI in the last 168 hours. BNP (last 3 results) No results for input(s): PROBNP in the last 8760 hours. HbA1C: No results for input(s): HGBA1C in the last 72 hours. CBG: Recent Labs  Lab 12/14/20 1459 12/14/20 1551  GLUCAP 201* 213*   Lipid Profile: No results for input(s): CHOL, HDL, LDLCALC, TRIG, CHOLHDL, LDLDIRECT in the last 72 hours. Thyroid Function Tests: No results for input(s): TSH, T4TOTAL, FREET4, T3FREE, THYROIDAB in the last 72 hours. Anemia Panel: No results for input(s): VITAMINB12, FOLATE, FERRITIN, TIBC, IRON, RETICCTPCT in the last 72 hours. Urine analysis: No results found for: COLORURINE, APPEARANCEUR, LABSPEC, [place], GLUCOSEU, [place], BILIRUBINUR, KETONESUR, PROTEINUR, UROBILINOGEN, NITRITE, LEUKOCYTESUR  Radiological Exams on Admission: CT HEAD CODE STROKE WO CONTRAST  Result Date: 12/14/2020 CLINICAL DATA:  Code stroke.  Left-sided weakness EXAM: CT HEAD  WITHOUT CONTRAST TECHNIQUE: Contiguous axial images were obtained from the base of the skull through the vertex without intravenous contrast. COMPARISON:  11/13/2020 FINDINGS: Brain: No acute intracranial hemorrhage, mass effect, or edema. Gray-white differentiation is preserved. There is no extra-axial collection. Ventricles and sulci are stable in size and configuration. Vascular: No hyperdense vessel. There is intracranial atherosclerotic calcification at the skull base. Skull: Unremarkable. Sinuses/Orbits: Polypoid soft tissue within the nasal cavity. Mild paranasal sinus mucosal thickening. Orbits are unremarkable. Other: Mastoid air cells are clear. ASPECTS ([place] Stroke Program Early CT Score) - Ganglionic level infarction (caudate, lentiform nuclei, internal capsule, insula, M1-M3 cortex): 7 - Supraganglionic infarction (M4-M6 cortex): 3 Total score (0-10 with 10 being normal): 10 IMPRESSION: There is no acute intracranial hemorrhage or evidence of acute infarction. ASPECT score is 10. Probable nasal polyposis. These results were communicated to Dr. Cheral Marker at 3:10 pm on 12/14/2020 by text page via the [facility] messaging system. Electronically Signed  By: Macy Mis M.D.   On: 12/14/2020 15:17    EKG: Independently reviewed. Sinus (baseline appears to have artifact from tremors)  Assessment/Plan Active Problems:   TIA (transient ischemic attack)   CVA (cerebral vascular accident) ([place])  (please populate well all problems here in Problem List. (For example, if patient is on BP meds at home and you resume or decide to hold them, it is a problem that needs to be her. Same for CAD, COPD, HLD and so on)  TIA -He has new onset symptoms of slurred speech and ataxia, along with nystagmus and dizziness, concerning about posterior CVA. -MRI pending  AKI on CKD -Signs of hypovolemia -Hold Bumex -Normal saline at 75 mL/h x 12 hours -Nephro consult in ED.  Chronic systolic CHF secondary to  ischemic cardiomyopathy -Signs of hypovolemia with AKI -Slow hydration for 12 hours then re-evaluate -Check digoxin level, hold digoxin for now given worsening of kidney function  Tremors -No clear etiology -Check TSH. -Check UDS  Chronic iron deficiency anemia -Check iron level -Check reticulocyte count  HTN -Permissive hypertension, hold Bumex, metoprolol, losartan and Aldactone -Urine hydralazine  IDDM with hyperglycemia -NovoLog 3 units 3 times daily AC plus sliding scale  History of cervical myelopathy - [facility] record: MRI on 12/06/2021shows "Subtle convex left thoracic scoliosis. Cervical kyphosis centered at C4-5 with subtle retrolisthesis of C4 on C5 and C5 on C6. Severe C5-6, moderate-severe C4-5 disc disease. Moderate multilevel thoracic disc disease. No significant lumbar disc disease. Vertebral body heights are preserved." -If MRI brain negative, and patient continues to experience dizziness and ataxia, consider MRI cervical spine  BPH -On Flomax -Check UA -Urinary symptoms probably from indwelling Foley, which was pulled out this morning.  DM neuropathy -Cut down Lyrica dosage  Hypothyroidism -Continue Synthroid  CAD -Continue ASA and Plavix  DVT prophylaxis: Heparin subcu  code Status: Full code Family Communication: None at bedside Disposition Plan: Expect more than 2 midnight hospital stay, for CVA work up and PT evaluation, may need rehab Consults called: Neurology, nephrology Admission status: Tele admit   Lequita Halt MD [facility] Pager [phone]  12/14/2020, 5:50 PM

## 2020-12-15 ENCOUNTER — Inpatient Hospital Stay (HOSPITAL_COMMUNITY): Payer: Medicare Other

## 2020-12-15 DIAGNOSIS — N179 Acute kidney failure, unspecified: Secondary | ICD-10-CM

## 2020-12-15 DIAGNOSIS — N1832 Chronic kidney disease, stage 3b: Secondary | ICD-10-CM | POA: Diagnosis not present

## 2020-12-15 DIAGNOSIS — I6389 Other cerebral infarction: Secondary | ICD-10-CM | POA: Diagnosis not present

## 2020-12-15 DIAGNOSIS — D500 Iron deficiency anemia secondary to blood loss (chronic): Secondary | ICD-10-CM

## 2020-12-15 DIAGNOSIS — R278 Other lack of coordination: Secondary | ICD-10-CM

## 2020-12-15 DIAGNOSIS — N183 Chronic kidney disease, stage 3 unspecified: Secondary | ICD-10-CM

## 2020-12-15 DIAGNOSIS — I951 Orthostatic hypotension: Principal | ICD-10-CM

## 2020-12-15 DIAGNOSIS — I10 Essential (primary) hypertension: Secondary | ICD-10-CM

## 2020-12-15 DIAGNOSIS — I5022 Chronic systolic (congestive) heart failure: Secondary | ICD-10-CM

## 2020-12-15 DIAGNOSIS — Z794 Long term (current) use of insulin: Secondary | ICD-10-CM

## 2020-12-15 DIAGNOSIS — E119 Type 2 diabetes mellitus without complications: Secondary | ICD-10-CM

## 2020-12-15 LAB — ECHOCARDIOGRAM COMPLETE
AR max vel: 3.23 cm2
AV Area VTI: 3.11 cm2
AV Area mean vel: 2.86 cm2
AV Mean grad: 5 mmHg
AV Peak grad: 8.4 mmHg
Ao pk vel: 1.45 m/s
Area-P 1/2: 3.77 cm2
Height: 68 in
MV M vel: 5.65 m/s
MV Peak grad: 127.7 mmHg
MV VTI: 2.11 cm2
S' Lateral: 4.4 cm
Weight: 2991.2 oz

## 2020-12-15 LAB — URINALYSIS, ROUTINE W REFLEX MICROSCOPIC
Bilirubin Urine: NEGATIVE
Glucose, UA: NEGATIVE mg/dL
Ketones, ur: NEGATIVE mg/dL
Nitrite: NEGATIVE
Protein, ur: 100 mg/dL — AB
RBC / HPF: >50 RBC/hpf — ABNORMAL HIGH (ref 0–5)
Specific Gravity, Urine: 1.01 (ref 1.005–1.030)
pH: 6 (ref 5.0–8.0)

## 2020-12-15 LAB — HEMOGLOBIN A1C
Hgb A1c MFr Bld: 7.1 % — ABNORMAL HIGH (ref 4.8–5.6)
Mean Plasma Glucose: 157.07 mg/dL

## 2020-12-15 LAB — GLUCOSE, CAPILLARY
Glucose-Capillary: 136 mg/dL — ABNORMAL HIGH (ref 70–99)
Glucose-Capillary: 141 mg/dL — ABNORMAL HIGH (ref 70–99)
Glucose-Capillary: 169 mg/dL — ABNORMAL HIGH (ref 70–99)

## 2020-12-15 LAB — CBG MONITORING, ED
Glucose-Capillary: 150 mg/dL — ABNORMAL HIGH (ref 70–99)
Glucose-Capillary: 129 mg/dL — ABNORMAL HIGH (ref 70–99)

## 2020-12-15 LAB — SARS CORONAVIRUS 2 (TAT 6-24 HRS): SARS Coronavirus 2: NEGATIVE

## 2020-12-15 LAB — LIPID PANEL
Cholesterol: 94 mg/dL (ref 0–200)
HDL: 19 mg/dL — ABNORMAL LOW (ref >40)
LDL Cholesterol: 38 mg/dL (ref 0–99)
Total CHOL/HDL Ratio: 4.9 RATIO
Triglycerides: 186 mg/dL — ABNORMAL HIGH (ref <150)
VLDL: 37 mg/dL (ref 0–40)

## 2020-12-15 LAB — RAPID URINE DRUG SCREEN, HOSP PERFORMED
Amphetamines: NOT DETECTED
Barbiturates: NOT DETECTED
Benzodiazepines: NOT DETECTED
Cocaine: NOT DETECTED
Opiates: NOT DETECTED
Tetrahydrocannabinol: NOT DETECTED

## 2020-12-15 MED ORDER — INSULIN GLARGINE 100 UNIT/ML ~~LOC~~ SOLN
25.0000 [IU] | Freq: Two times a day (BID) | SUBCUTANEOUS | Status: DC
  Administered 2020-12-15 – 2020-12-17 (×5): 25 [IU] via SUBCUTANEOUS
  Filled 2020-12-15 (×6): qty 0.25

## 2020-12-15 MED ORDER — INSULIN ASPART 100 UNIT/ML ~~LOC~~ SOLN
3.0000 [IU] | Freq: Three times a day (TID) | SUBCUTANEOUS | Status: DC
  Administered 2020-12-15 – 2020-12-17 (×5): 3 [IU] via SUBCUTANEOUS

## 2020-12-15 MED ORDER — ASPIRIN EC 81 MG PO TBEC
81.0000 mg | DELAYED_RELEASE_TABLET | Freq: Every day | ORAL | Status: DC
  Administered 2020-12-15 – 2020-12-17 (×3): 81 mg via ORAL
  Filled 2020-12-15 (×3): qty 1

## 2020-12-15 MED ORDER — CLOPIDOGREL BISULFATE 75 MG PO TABS
75.0000 mg | ORAL_TABLET | Freq: Every day | ORAL | Status: DC
  Administered 2020-12-15 – 2020-12-17 (×3): 75 mg via ORAL
  Filled 2020-12-15 (×3): qty 1

## 2020-12-15 MED ORDER — PREGABALIN 75 MG PO CAPS: 75.0000 mg | ORAL_CAPSULE | Freq: Two times a day (BID) | ORAL | Status: DC

## 2020-12-15 MED ORDER — LEVOTHYROXINE SODIUM 88 MCG PO TABS
88.0000 ug | ORAL_TABLET | Freq: Every day | ORAL | Status: DC
  Administered 2020-12-15 – 2020-12-17 (×3): 88 ug via ORAL
  Filled 2020-12-15 (×3): qty 1

## 2020-12-15 MED ORDER — METOPROLOL SUCCINATE ER 100 MG PO TB24
200.0000 mg | ORAL_TABLET | Freq: Two times a day (BID) | ORAL | Status: DC
  Administered 2020-12-15 – 2020-12-17 (×5): 200 mg via ORAL
  Filled 2020-12-15 (×5): qty 2

## 2020-12-15 MED ORDER — DIGOXIN 125 MCG PO TABS: 125.0000 ug | ORAL_TABLET | Freq: Every day | ORAL | Status: DC

## 2020-12-15 MED ORDER — DULOXETINE HCL 30 MG PO CPEP
30.0000 mg | ORAL_CAPSULE | Freq: Every day | ORAL | Status: DC
  Administered 2020-12-15 – 2020-12-17 (×3): 30 mg via ORAL
  Filled 2020-12-15 (×3): qty 1

## 2020-12-15 MED ORDER — ISOSORBIDE MONONITRATE ER 30 MG PO TB24
30.0000 mg | ORAL_TABLET | Freq: Every day | ORAL | Status: DC
  Administered 2020-12-15 – 2020-12-17 (×3): 30 mg via ORAL
  Filled 2020-12-15 (×3): qty 1

## 2020-12-15 MED ORDER — INSULIN ASPART 100 UNIT/ML ~~LOC~~ SOLN
0.0000 [IU] | Freq: Three times a day (TID) | SUBCUTANEOUS | Status: DC
  Administered 2020-12-15 (×2): 1 [IU] via SUBCUTANEOUS
  Administered 2020-12-16: 2 [IU] via SUBCUTANEOUS

## 2020-12-15 MED ORDER — INSULIN ASPART 100 UNIT/ML ~~LOC~~ SOLN: 0.0000 [IU] | Freq: Every day | SUBCUTANEOUS | Status: DC

## 2020-12-15 MED ORDER — BISACODYL 5 MG PO TBEC
5.0000 mg | DELAYED_RELEASE_TABLET | Freq: Every day | ORAL | Status: DC
  Administered 2020-12-15 – 2020-12-17 (×3): 5 mg via ORAL
  Filled 2020-12-15 (×3): qty 1

## 2020-12-15 MED ORDER — MELATONIN 3 MG PO TABS
3.0000 mg | ORAL_TABLET | Freq: Every evening | ORAL | Status: DC | PRN
  Filled 2020-12-15: qty 1

## 2020-12-15 MED ORDER — TAMSULOSIN HCL 0.4 MG PO CAPS
0.4000 mg | ORAL_CAPSULE | Freq: Every day | ORAL | Status: DC
  Administered 2020-12-15 – 2020-12-17 (×3): 0.4 mg via ORAL
  Filled 2020-12-15 (×3): qty 1

## 2020-12-15 MED ORDER — ZOLPIDEM TARTRATE 5 MG PO TABS: 10.0000 mg | ORAL_TABLET | Freq: Every evening | ORAL | Status: DC | PRN

## 2020-12-15 MED ORDER — PERFLUTREN LIPID MICROSPHERE
1.0000 mL | INTRAVENOUS | Status: AC | PRN
  Administered 2020-12-15: 5 mL via INTRAVENOUS
  Filled 2020-12-15: qty 10

## 2020-12-15 MED ORDER — ATORVASTATIN CALCIUM 80 MG PO TABS
80.0000 mg | ORAL_TABLET | Freq: Every day | ORAL | Status: DC
  Administered 2020-12-15 – 2020-12-16 (×2): 80 mg via ORAL
  Filled 2020-12-15 (×2): qty 1

## 2020-12-15 MED ORDER — NITROGLYCERIN 0.4 MG SL SUBL: 0.4000 mg | SUBLINGUAL_TABLET | SUBLINGUAL | Status: DC | PRN

## 2020-12-15 NOTE — Progress Notes (Signed)
   Kent Russell is a 55 y.o. male patient admitted from ED. Arrived to the unit via stretcher at 1600 awake, alert  & orientated  X 4,  Full Code, VSS - Blood pressure (!) 152/78, pulse 73, temperature 98.1 F (36.7 C), temperature source Oral, resp. rate 18, height 5\' 8"  (1.727 m), weight 84.8 kg, SpO2 96 %., no c/o shortness of breath, no c/o chest pain, no distress noted. Tele # 12 placed and pt is currently running NSR  IV site WDL:  with a transparent dsg that's clean dry and intact.  Pt orientation to unit, room and routine. Information packet given to patient.  Admission INP armband ID verified with patient, and in place. SR up x 2, fall risk assessment complete with Patient and he verbalized understanding of risks associated with falls. Pt verbalizes an understanding of how to use the call bell and to call for help before getting out of bed.  Skin, clean-dry- intact without evidence of bruising, or skin tears.  Non blanching redness noted on buttock. mepilex border applied to prevent skin breakdown. Will cont to monitor and assist as needed.  Dorris Carnes, RN 12/15/2020 6:53 PM

## 2020-12-15 NOTE — Progress Notes (Signed)
[facility] PROGRESS NOTE    Progress Note  Kent Russell  XIP:[id number] DOB: 1966/10/07 DOA: 12/14/2020 PCP: Patient, No Pcp Per     Brief Narrative:   Kent Russell is an 55 y.o. male past medical history significant for CAD status post stenting, chronic systolic heart failure secondary to ischemic cardiomyopathy with an EF of 45%, chronic kidney disease stage IV, essential hypertension insulin-dependent diabetes mellitus chronic iron deficiency anemia, recently admitted to [facility] about 2 weeks prior to admission for acute kidney injury due to acute urinary retention a Foley was placed, started having bilateral lower extremity weakness and wobbly gait associated with significant dizziness  Significant studies: 12/14/2020 CT of the head showed no acute findings. 12/15/2020 MRI of the brain showed motion dehydrated, no acute intracranial abnormalities, mild chronic microvascular ischemic changes 2022: MRA of the of the head showed no acute obvious stenosis.  Antibiotics: None  Microbiology data: Blood culture:  Procedures:  Assessment/Plan:   Lightheadedness/orthostatic hypotension: Go ahead and check orthostatic vitals, he relates his Bumex weeks increasing he was started recently on Aldactone when he was admitted at [facility] 2 weeks ago. His creatinine is up by almost 3/4 of the point. Ahead and hold diuretics will allow oral hydration, he does have lower extremity edema but his lungs are clean and he has negative JVD and a negative hepatojugular reflux. He is not having symptoms of a stroke his neurological exam is intact, MRI of the brain is negative.  Acute kidney injury on chronic kidney disease stage IIIb: He relates dizziness upon standing, his baseline creatinine is like around 2.5 on admission 3.2 with an increase in his Bumex and Aldactone likely prerenal azotemia. Hold diuretics, continue IV fluid hydration. I am not sure if orthostatics will be  reliable.  Chronic systolic heart failure secondary to ischemic cardiomyopathy: Definitely hypovolemic in the setting of acute kidney injury. He was started on IV fluids.  Hold diuretic therapy and ACE inhibitor due to his mild acute kidney injury. Continue hydralazine and Imdur and metoprolol.  Tremors: Now resolved UDS is negative.  0.3.  Chronic iron deficiency anemia: Hemoglobin stable.  Essential hypertension: We will permissive hypertension, continue use hydralazine.  Insulin-dependent diabetes mellitus type 2: We will start him on long-acting insulin continue sliding scale.  History of cervical myelopathy. Stable will need to follow-up at [facility] an outpatient.   DVT prophylaxis: lovenox Family Communication:none Status is: Inpatient  Remains inpatient appropriate because:Hemodynamically unstable   Dispo: The patient is from: Home              Anticipated d/c is to: Home              Anticipated d/c date is: 2 days              Patient currently is not medically stable to d/c.   Difficult to place patient No        Code Status:     Code Status Orders  (From admission, onward)         Start     Ordered   12/14/20 1741  Full code  Continuous        12/14/20 1743        Code Status History    This patient has a current code status but no historical code status.   Advance Care Planning Activity        IV Access:    Peripheral IV   Procedures and diagnostic studies:  MR ANGIO HEAD WO CONTRAST  Result Date: 12/14/2020 CLINICAL DATA:  Initial evaluation for neuro deficit, stroke suspected. Weakness, speech abnormality. EXAM: MRI HEAD WITHOUT CONTRAST MRA HEAD WITHOUT CONTRAST TECHNIQUE: Multiplanar, multiecho pulse sequences of the brain and surrounding structures were obtained without intravenous contrast. Angiographic images of the head were obtained using MRA technique without contrast. COMPARISON:  Prior head CT from earlier the same day as  well as recent brain MRI from 11/16/2020. FINDINGS: MRI HEAD FINDINGS Brain: Examination degraded by motion artifact. Cerebral volume within normal limits for age. Patchy T2/FLAIR hyperintensity within the periventricular and deep white matter both cerebral hemispheres most consistent with chronic small vessel ischemic disease, mild in nature. No abnormal foci of restricted diffusion to suggest acute or subacute ischemia. Gray-white matter differentiation maintained. No encephalomalacia to suggest chronic cortical infarction. No foci of susceptibility artifact to suggest acute or chronic intracranial hemorrhage. No mass lesion, midline shift or mass effect. No hydrocephalus or extra-axial fluid collection. Pituitary gland suprasellar region normal. Midline structures intact. Vascular: Major intracranial vascular flow voids are maintained. Skull and upper cervical spine: Craniocervical junction within normal limits. Bone marrow signal intensity normal. No scalp soft tissue abnormality. Sinuses/Orbits: Globes and orbital soft tissues within normal limits. Mild scattered mucosal thickening noted within the ethmoidal air cells and maxillary sinuses. Paranasal sinuses are otherwise largely clear. Trace fluid signal intensity noted within the right mastoid air cells, of doubtful significance. Inner ear structures grossly normal. Other: None. MRA HEAD FINDINGS ANTERIOR CIRCULATION: Examination degraded by motion artifact. Visualized distal cervical segments of the internal carotid arteries are patent with antegrade flow. Petrous, cavernous, and supraclinoid segments patent without stenosis or other abnormality. A1 segments patent bilaterally. Grossly normal anterior communicating artery complex. Anterior cerebral arteries patent to their distal aspects without stenosis. No M1 stenosis or occlusion. Grossly normal MCA bifurcations. Distal MCA branches well perfused and symmetric. POSTERIOR CIRCULATION: Visualized V4  segments widely patent to the vertebrobasilar junction. Both PICA origins patent and normal. Basilar patent to its distal aspect without stenosis. Superior cerebellar arteries patent bilaterally. Right PCA primarily supplied via the basilar, although a small right posterior communicating arteries noted. Hypoplastic left P1 segment and robust left posterior communicating artery supplies the left PCA. Both PCAs well perfused to their distal aspects. No intracranial aneurysm. IMPRESSION: MRI HEAD IMPRESSION: 1. Motion degraded exam. 2. No acute intracranial infarct or other abnormality. 3. Mild chronic microvascular ischemic disease for age. MRA HEAD IMPRESSION: 1. Technically limited exam due to motion artifact. 2. Grossly negative intracranial MRA. No large vessel occlusion, hemodynamically significant stenosis, or other acute vascular abnormality. Electronically Signed   By: Jeannine Boga M.D.   On: 12/14/2020 18:06   MR BRAIN WO CONTRAST  Result Date: 12/14/2020 CLINICAL DATA:  Initial evaluation for neuro deficit, stroke suspected. Weakness, speech abnormality. EXAM: MRI HEAD WITHOUT CONTRAST MRA HEAD WITHOUT CONTRAST TECHNIQUE: Multiplanar, multiecho pulse sequences of the brain and surrounding structures were obtained without intravenous contrast. Angiographic images of the head were obtained using MRA technique without contrast. COMPARISON:  Prior head CT from earlier the same day as well as recent brain MRI from 11/16/2020. FINDINGS: MRI HEAD FINDINGS Brain: Examination degraded by motion artifact. Cerebral volume within normal limits for age. Patchy T2/FLAIR hyperintensity within the periventricular and deep white matter both cerebral hemispheres most consistent with chronic small vessel ischemic disease, mild in nature. No abnormal foci of restricted diffusion to suggest acute or subacute ischemia. Gray-white matter differentiation maintained. No encephalomalacia to suggest  chronic cortical  infarction. No foci of susceptibility artifact to suggest acute or chronic intracranial hemorrhage. No mass lesion, midline shift or mass effect. No hydrocephalus or extra-axial fluid collection. Pituitary gland suprasellar region normal. Midline structures intact. Vascular: Major intracranial vascular flow voids are maintained. Skull and upper cervical spine: Craniocervical junction within normal limits. Bone marrow signal intensity normal. No scalp soft tissue abnormality. Sinuses/Orbits: Globes and orbital soft tissues within normal limits. Mild scattered mucosal thickening noted within the ethmoidal air cells and maxillary sinuses. Paranasal sinuses are otherwise largely clear. Trace fluid signal intensity noted within the right mastoid air cells, of doubtful significance. Inner ear structures grossly normal. Other: None. MRA HEAD FINDINGS ANTERIOR CIRCULATION: Examination degraded by motion artifact. Visualized distal cervical segments of the internal carotid arteries are patent with antegrade flow. Petrous, cavernous, and supraclinoid segments patent without stenosis or other abnormality. A1 segments patent bilaterally. Grossly normal anterior communicating artery complex. Anterior cerebral arteries patent to their distal aspects without stenosis. No M1 stenosis or occlusion. Grossly normal MCA bifurcations. Distal MCA branches well perfused and symmetric. POSTERIOR CIRCULATION: Visualized V4 segments widely patent to the vertebrobasilar junction. Both PICA origins patent and normal. Basilar patent to its distal aspect without stenosis. Superior cerebellar arteries patent bilaterally. Right PCA primarily supplied via the basilar, although a small right posterior communicating arteries noted. Hypoplastic left P1 segment and robust left posterior communicating artery supplies the left PCA. Both PCAs well perfused to their distal aspects. No intracranial aneurysm. IMPRESSION: MRI HEAD IMPRESSION: 1. Motion  degraded exam. 2. No acute intracranial infarct or other abnormality. 3. Mild chronic microvascular ischemic disease for age. MRA HEAD IMPRESSION: 1. Technically limited exam due to motion artifact. 2. Grossly negative intracranial MRA. No large vessel occlusion, hemodynamically significant stenosis, or other acute vascular abnormality. Electronically Signed   By: Jeannine Boga M.D.   On: 12/14/2020 18:06   CT HEAD CODE STROKE WO CONTRAST  Result Date: 12/14/2020 CLINICAL DATA:  Code stroke.  Left-sided weakness EXAM: CT HEAD WITHOUT CONTRAST TECHNIQUE: Contiguous axial images were obtained from the base of the skull through the vertex without intravenous contrast. COMPARISON:  11/13/2020 FINDINGS: Brain: No acute intracranial hemorrhage, mass effect, or edema. Gray-white differentiation is preserved. There is no extra-axial collection. Ventricles and sulci are stable in size and configuration. Vascular: No hyperdense vessel. There is intracranial atherosclerotic calcification at the skull base. Skull: Unremarkable. Sinuses/Orbits: Polypoid soft tissue within the nasal cavity. Mild paranasal sinus mucosal thickening. Orbits are unremarkable. Other: Mastoid air cells are clear. ASPECTS (Willard Stroke Program Early CT Score) - Ganglionic level infarction (caudate, lentiform nuclei, internal capsule, insula, M1-M3 cortex): 7 - Supraganglionic infarction (M4-M6 cortex): 3 Total score (0-10 with 10 being normal): 10 IMPRESSION: There is no acute intracranial hemorrhage or evidence of acute infarction. ASPECT score is 10. Probable nasal polyposis. These results were communicated to Dr. Cheral Marker at 3:10 pm on 12/14/2020 by text page via the [facility] messaging system. Electronically Signed   By: Macy Mis M.D.   On: 12/14/2020 15:17     Medical Consultants:    None.   Subjective:    Kent Russell he relates he still has dizziness upon standing.  Objective:    Vitals:   12/15/20 0530 12/15/20  0545 12/15/20 0547 12/15/20 0630  BP: (!) 148/78 (!) 149/75  137/72  Pulse: 70 67 69 67  Resp: 15 (!) 21 18 18   Temp:      TempSrc:      SpO2: 92%  95% 95% 97%  Weight:      Height:       SpO2: 97 %   Intake/Output Summary (Last 24 hours) at 12/15/2020 0720 Last data filed at 12/15/2020 0412 Gross per 24 hour  Intake --  Output 500 ml  Net -500 ml   Filed Weights   12/14/20 1500 12/14/20 1605  Weight: 84.8 kg 84.8 kg    Exam: General exam: In no acute distress. Respiratory system: Good air movement and clear to auscultation. Cardiovascular system: S1 & S2 heard, RRR.  Negative JVD. Gastrointestinal system: Abdomen is nondistended, soft and nontender.  Central nervous system: Alert and oriented. No focal neurological deficits. Extremities: No pedal edema. Skin: No rashes, lesions or ulcers Psychiatry: Judgement and insight appear normal. Mood & affect appropriate.    Data Reviewed:    Labs: Basic Metabolic Panel: Recent Labs  Lab 12/14/20 1502 12/14/20 1511  NA 133* 134*  K 4.0 3.9  CL 96* 93*  CO2 27  --   GLUCOSE 228* 229*  BUN 37* 36*  CREATININE 3.23* 3.10*  CALCIUM 8.6*  --    GFR Estimated Creatinine Clearance: 28.6 mL/min (A) (by C-G formula based on SCr of 3.1 mg/dL (H)). Liver Function Tests: Recent Labs  Lab 12/14/20 1502  AST 16  ALT 15  ALKPHOS 36*  BILITOT 0.4  PROT 5.6*  ALBUMIN 2.7*   No results for input(s): LIPASE, AMYLASE in the last 168 hours. Recent Labs  Lab 12/14/20 1836  AMMONIA 15   Coagulation profile Recent Labs  Lab 12/14/20 1502  INR 1.2   COVID-19 Labs  Recent Labs    12/14/20 1836  FERRITIN 74    No results found for: SARSCOV2NAA  CBC: Recent Labs  Lab 12/14/20 1502 12/14/20 1511  WBC 10.1  --   NEUTROABS 8.3*  --   HGB 7.9* 8.5*  HCT 25.0* 25.0*  MCV 92.9  --   PLT 264  --    Cardiac Enzymes: No results for input(s): CKTOTAL, CKMB, CKMBINDEX, TROPONINI in the last 168 hours. BNP (last 3  results) No results for input(s): PROBNP in the last 8760 hours. CBG: Recent Labs  Lab 12/14/20 1459 12/14/20 1551 12/14/20 2205  GLUCAP 201* 213* 138*   D-Dimer: No results for input(s): DDIMER in the last 72 hours. Hgb A1c: Recent Labs    12/15/20 0228  HGBA1C 7.1*   Lipid Profile: Recent Labs    12/15/20 0228  CHOL 94  HDL 19*  LDLCALC 38  TRIG 186*  CHOLHDL 4.9   Thyroid function studies: Recent Labs    12/14/20 1836  TSH 3.313   Anemia work up: Recent Labs    12/14/20 1836  FERRITIN 74  TIBC 248*  IRON 30*  RETICCTPCT 2.6   Sepsis Labs: Recent Labs  Lab 12/14/20 1502  WBC 10.1   Microbiology No results found for this or any previous visit (from the past 240 hour(s)).   Medications:   . aspirin EC  81 mg Oral Daily  . atorvastatin  80 mg Oral Daily  . clopidogrel  75 mg Oral Daily  . Ferrous Fumarate  1 tablet Oral Daily  . heparin  5,000 Units Subcutaneous Q8H  . insulin aspart  0-9 Units Subcutaneous TID WC  . insulin aspart  3 Units Subcutaneous TID WC  . levothyroxine  88 mcg Oral Q0600  . pregabalin  25 mg Oral Daily  . tamsulosin  0.4 mg Oral QPC supper   Continuous Infusions:  LOS: 1 day   Charlynne Cousins  [facility]  12/15/2020, 7:20 AM

## 2020-12-15 NOTE — Evaluation (Signed)
Physical Therapy Evaluation Patient Details Name: Kent Russell MRN: [id number] DOB: Feb 20, 1966 Today's Date: 12/15/2020   History of Present Illness  55 yo male with onset of dizziness and weakness was admitted for medical care.  Pt is out of town visiting his fiancee, going to stay with her after this.  Has recent admit for AKI, urine retention and foly installed.  Now has LE weakness and speech change, but (-) MRI of head.  PMHx:  CAD, stenting, CHF, ischemic cardiomyopathy, EF 45%, CKD 4, HTN, DM, PN, cervical myelopathy, chronic anemia, BPH, CA, thyroid disease, transfusion  Clinical Impression  Pt is up to side of bed with assistance and noted his struggle with standing due to the calf pain on BLE's.  Pt can only stand a short time with just enough time to check orthostatics.  Supine BP was 162/78, sitting 167/86 and  Standing was 126/75.  Follow along to get pt past his tightness and pain in calves.  Follow acutely with progression of goals as tolerated.  Pt is expecting to go to rehab after hosp, and will work toward home as tolerated.    Follow Up Recommendations SNF    Equipment Recommendations  Rolling walker with 5" wheels    Recommendations for Other Services       Precautions / Restrictions Precautions Precautions: Fall Precaution Comments: pt cannnt stand long due to pain on B calves Restrictions Weight Bearing Restrictions: No Other Position/Activity Restrictions: cannot tolerate wb on B feet due to calf pain      Mobility  Bed Mobility Overal bed mobility: Needs Assistance Bed Mobility: Supine to Sit;Sit to Supine     Supine to sit: Min assist Sit to supine: Min guard   General bed mobility comments: gave pt some instructions for transtion to side of bed but requires help for all sequencing    Transfers Overall transfer level: Needs assistance Equipment used: Rolling walker (2 wheeled);1 person hand held assist Transfers: Sit to/from Stand Sit to Stand:  Mod assist;Min assist         General transfer comment: mod assist to control descent of standing  Ambulation/Gait Ambulation/Gait assistance: Mod assist Gait Distance (Feet): 3 Feet Assistive device: Rolling walker (2 wheeled);1 person hand held assist Gait Pattern/deviations: Step-to pattern;Decreased stride length;Wide base of support Gait velocity: reduced Gait velocity interpretation: <1.8 ft/sec, indicate of risk for recurrent falls General Gait Details: pt is up to stand on RW with poor control of standing to take steps, unable but blames on calf pain  Stairs            Wheelchair Mobility    Modified Rankin (Stroke Patients Only)       Balance Overall balance assessment: Needs assistance Sitting-balance support: Bilateral upper extremity supported;Feet supported Sitting balance-Leahy Scale: Good   Postural control: Posterior lean Standing balance support: Bilateral upper extremity supported;During functional activity Standing balance-Leahy Scale: Fair Standing balance comment: less than fair dynamically                             Pertinent Vitals/Pain Pain Assessment: No/denies pain    Home Living Family/patient expects to be discharged to:: Private residence Living Arrangements: Children;Other relatives Available Help at Discharge: Family;Available 24 hours/day Type of Home: House Home Access: Ramped entrance;Stairs to enter Entrance Stairs-Rails: Right Entrance Stairs-Number of Steps: 3+1 Home Layout: One level Home Equipment: Walker - 2 wheels;Wheelchair - manual;Cane - single point Additional Comments: reports he needs  a new RW    Prior Function Level of Independence: Independent with assistive device(s)         Comments: help with mobility needed after fall 3 weeks ago     Hand Dominance   Dominant Hand: Right    Extremity/Trunk Assessment   Upper Extremity Assessment Upper Extremity Assessment: Overall WFL for tasks  assessed    Lower Extremity Assessment Lower Extremity Assessment: Generalized weakness    Cervical / Trunk Assessment Cervical / Trunk Assessment: Normal  Communication   Communication: No difficulties  Cognition Arousal/Alertness: Awake/alert Behavior During Therapy: WFL for tasks assessed/performed Overall Cognitive Status: Within Functional Limits for tasks assessed                                 General Comments: pt seems generally aware of his history, no real gaps      General Comments General comments (skin integrity, edema, etc.): Pt was up to side of bed with help, will need to be assisted for all mobility to stand or walk due to limited tolerance    Exercises     Assessment/Plan    PT Assessment Patient needs continued PT services  PT Problem List Decreased strength;Decreased range of motion;Decreased activity tolerance;Decreased mobility;Decreased balance;Decreased cognition;Decreased knowledge of use of DME;Decreased safety awareness;Cardiopulmonary status limiting activity;Decreased knowledge of precautions       PT Treatment Interventions DME instruction;Gait training;Functional mobility training;Therapeutic activities;Therapeutic exercise;Balance training;Neuromuscular re-education;Patient/family education    PT Goals (Current goals can be found in the Care Plan section)  Acute Rehab PT Goals Patient Stated Goal: to walk again PT Goal Formulation: With patient Time For Goal Achievement: 12/29/20 Potential to Achieve Goals: Good    Frequency Min 3X/week   Barriers to discharge Inaccessible home environment;Decreased caregiver support      Co-evaluation               AM-PAC PT "6 Clicks" Mobility  Outcome Measure Help needed turning from your back to your side while in a flat bed without using bedrails?: A Little Help needed moving from lying on your back to sitting on the side of a flat bed without using bedrails?: A Lot Help  needed moving to and from a bed to a chair (including a wheelchair)?: A Lot Help needed standing up from a chair using your arms (e.g., wheelchair or bedside chair)?: A Lot Help needed to walk in hospital room?: A Lot Help needed climbing 3-5 steps with a railing? : Total 6 Click Score: 12    End of Session Equipment Utilized During Treatment: Gait belt;Oxygen Activity Tolerance: Patient limited by fatigue;Treatment limited secondary to medical complications (Comment) Patient left: in bed;with call bell/phone within reach;with bed alarm set Nurse Communication: Mobility status PT Visit Diagnosis: Unsteadiness on feet (R26.81);Muscle weakness (generalized) (M62.81);Adult, failure to thrive (R62.7)    Time: 2542-7062 PT Time Calculation (min) (ACUTE ONLY): 24 min   Charges:   PT Evaluation $PT Eval Moderate Complexity: 1 Mod PT Treatments $Therapeutic Activity: 8-22 mins       Ramond Dial 12/15/2020, 6:06 PM  Mee Hives, PT MS Acute Rehab Dept. Number: [place]

## 2020-12-15 NOTE — Progress Notes (Addendum)
Subjective: No acute overnight events.  Patient evaluated at bedside this morning, no family present in the room.  Patient states his tremors were completely gone this morning.  He notes this started 1 week ago in [facility] when he was started on couple of medications and he was told that they do not have answer for his ongoing tremors.  Denies any new complaints states he feels better than yesterday overall.  Objective: Current vital signs: BP (!) 150/66   Pulse 71   Temp 97.9 F (36.6 C) (Oral)   Resp 12   Ht 5\' 8"  (1.727 m)   Wt 84.8 kg   SpO2 94%   BMI 28.43 kg/m  Vital signs in last 24 hours: Temp:  [97.7 F (36.5 C)-97.9 F (36.6 C)] 97.9 F (36.6 C) (02/11 0215) Pulse Rate:  [59-74] 71 (02/11 1130) Resp:  [7-24] 12 (02/11 1045) BP: (99-163)/(59-91) 150/66 (02/11 1115) SpO2:  [91 %-100 %] 94 % (02/11 1130) Weight:  [84.8 kg] 84.8 kg (02/10 1605)  Intake/Output from previous day: 02/10 0701 - 02/11 0700 In: -  Out: 500 [Urine:500] Intake/Output this shift: No intake/output data recorded. Nutritional status:  Diet Order            Diet Carb Modified Fluid consistency: Thin; Room service appropriate? Yes  Diet effective now                Physical Exam  Constitutional: Appears well-developed and well-nourished.  Psych: Affect appropriate to situation Eyes: No scleral injection HENT: No OP obstruction. Oral mucosa appear somewhat dry.  Head: Normocephalic.  Cardiovascular: RRR Respiratory: Effort normal.  GI: Soft.  Skin: WDI  Neurologic Exam: Mental Status: Alert, oriented, thought content appropriate.  Speech fluent without evidence of aphasia.  Able to follow 3 step commands without difficulty. Cranial Nerves: II:  Visual fields grossly normal, pupils equal, round, reactive to light and accommodation III,IV, VI: ptosis not present, extra-ocular motions intact bilaterally. Right eye amblyopia present since birth. V,VII: smile symmetric, facial  light touch sensation normal bilaterally VIII: hearing normal bilaterally IX,X: uvula rises symmetrically XI: bilateral shoulder shrug XII: midline tongue extension without atrophy or fasciculations Motor: Right : Upper extremity   4+/5    Left:     Upper extremity   4+/5  Lower extremity   4/5     Lower extremity   4/5, left foot                                                                                                                        dorsiflexion 3/5 Tone and bulk:normal tone throughout; no atrophy noted Sensory: Stocking distribution sensory loss due to diabetes mellitus Deep Tendon Reflexes:  Right: Upper Extremity   Left: Upper extremity   biceps (C-5 to C-6) 1+/4   biceps (C-5 to C-6) 1+/4 tricep (C7) 2/4    triceps (C7) 2/4 Brachioradialis (C6) 2/4  Brachioradialis (C6) 2/4  Lower Extremity Lower Extremity  quadriceps (L-2 to  L-4) 2/4   quadriceps (L-2 to L-4) 2/4 Achilles (S1) 0/4   Achilles (S1) 0/4 Plantars: Right: downgoing   Left: downgoing Cerebellar: normal finger-to-nose,  normal heel-to-shin test bilaterally.  Coarse tremor noted in upper extremities bilaterally right > left. No tremor noted in lower extremities. Tremor significantly improved since yesterday Gait: Deferred  Lab Results: Results for orders placed or performed during the hospital encounter of 12/14/20 (from the past 48 hour(s))  CBG monitoring, ED     Status: Abnormal   Collection Time: 12/14/20  2:59 PM  Result Value Ref Range   Glucose-Capillary 201 (H) 70 - 99 mg/dL    Comment: Glucose reference range applies only to samples taken after fasting for at least 8 hours.  Protime-INR     Status: None   Collection Time: 12/14/20  3:02 PM  Result Value Ref Range   Prothrombin Time 15.1 11.4 - 15.2 seconds   INR 1.2 0.8 - 1.2    Comment: (NOTE) INR goal varies based on device and disease states. Performed at [facility], [place] [address], [place], [place] [address]   APTT      Status: None   Collection Time: 12/14/20  3:02 PM  Result Value Ref Range   aPTT 32 24 - 36 seconds    Comment: Performed at [facility] [address] [place], [place] [address]  CBC     Status: Abnormal   Collection Time: 12/14/20  3:02 PM  Result Value Ref Range   WBC 10.1 4.0 - 10.5 K/uL   RBC 2.69 (L) 4.22 - 5.81 MIL/uL   Hemoglobin 7.9 (L) 13.0 - 17.0 g/dL   HCT 25.0 (L) 39.0 - 52.0 %   MCV 92.9 80.0 - 100.0 fL   MCH 29.4 26.0 - 34.0 pg   MCHC 31.6 30.0 - 36.0 g/dL   RDW 13.2 11.5 - 15.5 %   Platelets 264 150 - 400 K/uL   nRBC 0.0 0.0 - 0.2 %    Comment: Performed at [facility], [place] [address] [place], [place] [address]  Differential     Status: Abnormal   Collection Time: 12/14/20  3:02 PM  Result Value Ref Range   Neutrophils Relative % 82 %   Neutro Abs 8.3 (H) 1.7 - 7.7 K/uL   Lymphocytes Relative 8 %   Lymphs Abs 0.8 0.7 - 4.0 K/uL   Monocytes Relative 8 %   Monocytes Absolute 0.8 0.1 - 1.0 K/uL   Eosinophils Relative 1 %   Eosinophils Absolute 0.1 0.0 - 0.5 K/uL   Basophils Relative 0 %   Basophils Absolute 0.0 0.0 - 0.1 K/uL   Immature Granulocytes 1 %   Abs Immature Granulocytes 0.05 0.00 - 0.07 K/uL    Comment: Performed at [facility] [address] [place], [place] [address]  Comprehensive metabolic panel     Status: Abnormal   Collection Time: 12/14/20  3:02 PM  Result Value Ref Range   Sodium 133 (L) 135 - 145 mmol/L   Potassium 4.0 3.5 - 5.1 mmol/L   Chloride 96 (L) 98 - 111 mmol/L   CO2 27 22 - 32 mmol/L   Glucose, Bld 228 (H) 70 - 99 mg/dL    Comment: Glucose reference range applies only to samples taken after fasting for at least 8 hours.   BUN 37 (H) 6 - 20 mg/dL   Creatinine, Ser 3.23 (H) 0.61 - 1.24 mg/dL   Calcium 8.6 (  L) 8.9 - 10.3 mg/dL   Total Protein 5.6 (L) 6.5 - 8.1 g/dL   Albumin 2.7 (L) 3.5 - 5.0 g/dL   AST 16 15 - 41 U/L   ALT 15 0 - 44 U/L   Alkaline Phosphatase 36 (L) 38 - 126 U/L   Total Bilirubin  0.4 0.3 - 1.2 mg/dL   GFR, Estimated 22 (L) >60 mL/min    Comment: (NOTE) Calculated using the CKD-EPI Creatinine Equation (2021)    Anion gap 10 5 - 15    Comment: Performed at [facility] [address], [place], [place] [address]  I-stat chem 8, ED     Status: Abnormal   Collection Time: 12/14/20  3:11 PM  Result Value Ref Range   Sodium 134 (L) 135 - 145 mmol/L   Potassium 3.9 3.5 - 5.1 mmol/L   Chloride 93 (L) 98 - 111 mmol/L   BUN 36 (H) 6 - 20 mg/dL   Creatinine, Ser 3.10 (H) 0.61 - 1.24 mg/dL   Glucose, Bld 229 (H) 70 - 99 mg/dL    Comment: Glucose reference range applies only to samples taken after fasting for at least 8 hours.   Calcium, Ion 1.14 (L) 1.15 - 1.40 mmol/L   TCO2 28 22 - 32 mmol/L   Hemoglobin 8.5 (L) 13.0 - 17.0 g/dL   HCT 25.0 (L) 39.0 - 52.0 %  CBG monitoring, ED     Status: Abnormal   Collection Time: 12/14/20  3:51 PM  Result Value Ref Range   Glucose-Capillary 213 (H) 70 - 99 mg/dL    Comment: Glucose reference range applies only to samples taken after fasting for at least 8 hours.   Comment 1 Notify RN    Comment 2 Document in Chart   Ammonia     Status: None   Collection Time: 12/14/20  6:36 PM  Result Value Ref Range   Ammonia 15 9 - 35 umol/L    Comment: Performed at [facility], [place] [address] [place], [place] [address]  Iron and TIBC     Status: Abnormal   Collection Time: 12/14/20  6:36 PM  Result Value Ref Range   Iron 30 (L) 45 - 182 ug/dL   TIBC 248 (L) 250 - 450 ug/dL   Saturation Ratios 12 (L) 17.9 - 39.5 %   UIBC 218 ug/dL    Comment: Performed at [facility], [place] [address], [place], [place] [address]  Ferritin     Status: None   Collection Time: 12/14/20  6:36 PM  Result Value Ref Range   Ferritin 74 24 - 336 ng/mL    Comment: Performed at [facility] [address], [place], [place] [address]  HIV Antibody (routine testing w rflx)     Status: None   Collection Time: 12/14/20  6:36 PM  Result  Value Ref Range   HIV Screen 4th Generation wRfx Non Reactive Non Reactive    Comment: Performed at [facility], [place] [address], [place], [place] [address]  TSH     Status: None   Collection Time: 12/14/20  6:36 PM  Result Value Ref Range   TSH 3.313 0.350 - 4.500 uIU/mL    Comment: Performed by a 3rd Generation assay with a functional sensitivity of <=0.01 uIU/mL. Performed at [facility], [place] [address], [place], [place] [address]   Digoxin level     Status: Abnormal   Collection Time: 12/14/20  6:36 PM  Result Value Ref Range   Digoxin Level 2.2 (H) 0.8 - 2.0 ng/mL    Comment: Performed at [facility], [place] [address] [place], [place] [address]  Reticulocytes     Status: Abnormal   Collection Time: 12/14/20  6:36 PM  Result Value Ref Range   Retic Ct Pct 2.6 0.4 - 3.1 %   RBC. 2.63 (L) 4.22 - 5.81 MIL/uL   Retic Count, Absolute 69.4 19.0 - 186.0 K/uL   Immature Retic Fract 9.0 2.3 - 15.9 %    Comment: Performed at [facility] [address] [place], [place] [address]  CBG monitoring, ED     Status: Abnormal   Collection Time: 12/14/20 10:05 PM  Result Value Ref Range   Glucose-Capillary 138 (H) 70 - 99 mg/dL    Comment: Glucose reference range applies only to samples taken after fasting for at least 8 hours.  Urinalysis, Routine w reflex microscopic Urine, Clean Catch     Status: Abnormal   Collection Time: 12/15/20 12:34 AM  Result Value Ref Range   Color, Urine YELLOW YELLOW   APPearance HAZY (A) CLEAR   Specific Gravity, Urine 1.010 1.005 - 1.030   pH 6.0 5.0 - 8.0   Glucose, UA NEGATIVE NEGATIVE mg/dL   Hgb urine dipstick MODERATE (A) NEGATIVE   Bilirubin Urine NEGATIVE NEGATIVE   Ketones, ur NEGATIVE NEGATIVE mg/dL   Protein, ur 100 (A) NEGATIVE mg/dL   Nitrite NEGATIVE NEGATIVE   Leukocytes,Ua SMALL (A) NEGATIVE   RBC / HPF >50 (H) 0 - 5 RBC/hpf   WBC, UA 11-20 0 - 5 WBC/hpf   Bacteria, UA RARE (A) NONE SEEN   Squamous Epithelial /  LPF 0-5 0 - 5   Hyaline Casts, UA PRESENT     Comment: Performed at [facility], [address], [place], [place] [address]  Urine rapid drug screen (hosp performed)     Status: None   Collection Time: 12/15/20 12:34 AM  Result Value Ref Range   Opiates NONE DETECTED NONE DETECTED   Cocaine NONE DETECTED NONE DETECTED   Benzodiazepines NONE DETECTED NONE DETECTED   Amphetamines NONE DETECTED NONE DETECTED   Tetrahydrocannabinol NONE DETECTED NONE DETECTED   Barbiturates NONE DETECTED NONE DETECTED    Comment: (NOTE) DRUG SCREEN FOR MEDICAL PURPOSES ONLY.  IF CONFIRMATION IS NEEDED FOR ANY PURPOSE, NOTIFY LAB WITHIN 5 DAYS.  LOWEST DETECTABLE LIMITS FOR URINE DRUG SCREEN Drug Class                     Cutoff (ng/mL) Amphetamine and metabolites    1000 Barbiturate and metabolites    200 Benzodiazepine                 622 Tricyclics and metabolites     300 Opiates and metabolites        300 Cocaine and metabolites        300 THC                            50 Performed at [facility], [place] [address] [place], [place] [address]   Hemoglobin A1c     Status: Abnormal   Collection Time: 12/15/20  2:28 AM  Result Value Ref Range   Hgb A1c MFr Bld 7.1 (H) 4.8 - 5.6 %    Comment: (NOTE) Pre diabetes:  5.7%-6.4%  Diabetes:              >6.4%  Glycemic control for   <7.0% adults with diabetes    Mean Plasma Glucose 157.07 mg/dL    Comment: Performed at [facility] [address], [place], [place] [address]  Lipid panel     Status: Abnormal   Collection Time: 12/15/20  2:28 AM  Result Value Ref Range   Cholesterol 94 0 - 200 mg/dL   Triglycerides 186 (H) <150 mg/dL   HDL 19 (L) >40 mg/dL   Total CHOL/HDL Ratio 4.9 RATIO   VLDL 37 0 - 40 mg/dL   LDL Cholesterol 38 0 - 99 mg/dL    Comment:        Total Cholesterol/HDL:CHD Risk Coronary Heart Disease Risk Table                     Men   Women  1/2 Average Risk   3.4   3.3  Average Risk       5.0    4.4  2 X Average Risk   9.6   7.1  3 X Average Risk  23.4   11.0        Use the calculated Patient Ratio above and the CHD Risk Table to determine the patient's CHD Risk.        ATP III CLASSIFICATION (LDL):  <100     mg/dL   Optimal  100-129  mg/dL   Near or Above                    Optimal  130-159  mg/dL   Borderline  160-189  mg/dL   High  >190     mg/dL   Very High Performed at [facility] [address], [place], [place] [address]   SARS CORONAVIRUS 2 (TAT 6-24 HRS) Nasopharyngeal Nasopharyngeal Swab     Status: None   Collection Time: 12/15/20  3:55 AM   Specimen: Nasopharyngeal Swab  Result Value Ref Range   SARS Coronavirus 2 NEGATIVE NEGATIVE    Comment: (NOTE) SARS-CoV-2 target nucleic acids are NOT DETECTED.  The SARS-CoV-2 RNA is generally detectable in upper and lower respiratory specimens during the acute phase of infection. Negative results do not preclude SARS-CoV-2 infection, do not rule out co-infections with other pathogens, and should not be used as the sole basis for treatment or other patient management decisions. Negative results must be combined with clinical observations, patient history, and epidemiological information. The expected result is Negative.  Fact Sheet for Patients: SugarRoll.be  Fact Sheet for Healthcare Providers: https://www.woods-mathews.com/  This test is not yet approved or cleared by the [place] FDA and  has been authorized for detection and/or diagnosis of SARS-CoV-2 by FDA under an Emergency Use Authorization (EUA). This EUA will remain  in effect (meaning this test can be used) for the duration of the COVID-19 declaration under Se ction 564(b)(1) of the Act, 21 U.S.C. section 360bbb-3(b)(1), unless the authorization is terminated or revoked sooner.  Performed at [facility], [place] [address] [place], [place] [address]   CBG monitoring, ED     Status:  Abnormal   Collection Time: 12/15/20  7:50 AM  Result Value Ref Range   Glucose-Capillary 150 (H) 70 - 99 mg/dL    Comment: Glucose reference range applies only to samples taken after fasting for at least 8 hours.  CBG monitoring, ED  Status: Abnormal   Collection Time: 12/15/20 11:47 AM  Result Value Ref Range   Glucose-Capillary 129 (H) 70 - 99 mg/dL    Comment: Glucose reference range applies only to samples taken after fasting for at least 8 hours.    Recent Results (from the past 240 hour(s))  SARS CORONAVIRUS 2 (TAT 6-24 HRS) Nasopharyngeal Nasopharyngeal Swab     Status: None   Collection Time: 12/15/20  3:55 AM   Specimen: Nasopharyngeal Swab  Result Value Ref Range Status   SARS Coronavirus 2 NEGATIVE NEGATIVE Final    Comment: (NOTE) SARS-CoV-2 target nucleic acids are NOT DETECTED.  The SARS-CoV-2 RNA is generally detectable in upper and lower respiratory specimens during the acute phase of infection. Negative results do not preclude SARS-CoV-2 infection, do not rule out co-infections with other pathogens, and should not be used as the sole basis for treatment or other patient management decisions. Negative results must be combined with clinical observations, patient history, and epidemiological information. The expected result is Negative.  Fact Sheet for Patients: SugarRoll.be  Fact Sheet for Healthcare Providers: https://www.woods-mathews.com/  This test is not yet approved or cleared by the [place] FDA and  has been authorized for detection and/or diagnosis of SARS-CoV-2 by FDA under an Emergency Use Authorization (EUA). This EUA will remain  in effect (meaning this test can be used) for the duration of the COVID-19 declaration under Se ction 564(b)(1) of the Act, 21 U.S.C. section 360bbb-3(b)(1), unless the authorization is terminated or revoked sooner.  Performed at [facility], [place] [address]  [place], [place] [address]     Lipid Panel Recent Labs    12/15/20 0228  CHOL 94  TRIG 186*  HDL 19*  CHOLHDL 4.9  VLDL 37  LDLCALC 38    Studies/Results: MR ANGIO HEAD WO CONTRAST  Result Date: 12/14/2020 CLINICAL DATA:  Initial evaluation for neuro deficit, stroke suspected. Weakness, speech abnormality. EXAM: MRI HEAD WITHOUT CONTRAST MRA HEAD WITHOUT CONTRAST TECHNIQUE: Multiplanar, multiecho pulse sequences of the brain and surrounding structures were obtained without intravenous contrast. Angiographic images of the head were obtained using MRA technique without contrast. COMPARISON:  Prior head CT from earlier the same day as well as recent brain MRI from 11/16/2020. FINDINGS: MRI HEAD FINDINGS Brain: Examination degraded by motion artifact. Cerebral volume within normal limits for age. Patchy T2/FLAIR hyperintensity within the periventricular and deep white matter both cerebral hemispheres most consistent with chronic small vessel ischemic disease, mild in nature. No abnormal foci of restricted diffusion to suggest acute or subacute ischemia. Gray-white matter differentiation maintained. No encephalomalacia to suggest chronic cortical infarction. No foci of susceptibility artifact to suggest acute or chronic intracranial hemorrhage. No mass lesion, midline shift or mass effect. No hydrocephalus or extra-axial fluid collection. Pituitary gland suprasellar region normal. Midline structures intact. Vascular: Major intracranial vascular flow voids are maintained. Skull and upper cervical spine: Craniocervical junction within normal limits. Bone marrow signal intensity normal. No scalp soft tissue abnormality. Sinuses/Orbits: Globes and orbital soft tissues within normal limits. Mild scattered mucosal thickening noted within the ethmoidal air cells and maxillary sinuses. Paranasal sinuses are otherwise largely clear. Trace fluid signal intensity noted within the right mastoid air cells, of doubtful  significance. Inner ear structures grossly normal. Other: None. MRA HEAD FINDINGS ANTERIOR CIRCULATION: Examination degraded by motion artifact. Visualized distal cervical segments of the internal carotid arteries are patent with antegrade flow. Petrous, cavernous, and supraclinoid segments patent without stenosis or other abnormality. A1 segments  patent bilaterally. Grossly normal anterior communicating artery complex. Anterior cerebral arteries patent to their distal aspects without stenosis. No M1 stenosis or occlusion. Grossly normal MCA bifurcations. Distal MCA branches well perfused and symmetric. POSTERIOR CIRCULATION: Visualized V4 segments widely patent to the vertebrobasilar junction. Both PICA origins patent and normal. Basilar patent to its distal aspect without stenosis. Superior cerebellar arteries patent bilaterally. Right PCA primarily supplied via the basilar, although a small right posterior communicating arteries noted. Hypoplastic left P1 segment and robust left posterior communicating artery supplies the left PCA. Both PCAs well perfused to their distal aspects. No intracranial aneurysm. IMPRESSION: MRI HEAD IMPRESSION: 1. Motion degraded exam. 2. No acute intracranial infarct or other abnormality. 3. Mild chronic microvascular ischemic disease for age. MRA HEAD IMPRESSION: 1. Technically limited exam due to motion artifact. 2. Grossly negative intracranial MRA. No large vessel occlusion, hemodynamically significant stenosis, or other acute vascular abnormality. Electronically Signed   By: Jeannine Boga M.D.   On: 12/14/2020 18:06   MR BRAIN WO CONTRAST  Result Date: 12/14/2020 CLINICAL DATA:  Initial evaluation for neuro deficit, stroke suspected. Weakness, speech abnormality. EXAM: MRI HEAD WITHOUT CONTRAST MRA HEAD WITHOUT CONTRAST TECHNIQUE: Multiplanar, multiecho pulse sequences of the brain and surrounding structures were obtained without intravenous contrast. Angiographic  images of the head were obtained using MRA technique without contrast. COMPARISON:  Prior head CT from earlier the same day as well as recent brain MRI from 11/16/2020. FINDINGS: MRI HEAD FINDINGS Brain: Examination degraded by motion artifact. Cerebral volume within normal limits for age. Patchy T2/FLAIR hyperintensity within the periventricular and deep white matter both cerebral hemispheres most consistent with chronic small vessel ischemic disease, mild in nature. No abnormal foci of restricted diffusion to suggest acute or subacute ischemia. Gray-white matter differentiation maintained. No encephalomalacia to suggest chronic cortical infarction. No foci of susceptibility artifact to suggest acute or chronic intracranial hemorrhage. No mass lesion, midline shift or mass effect. No hydrocephalus or extra-axial fluid collection. Pituitary gland suprasellar region normal. Midline structures intact. Vascular: Major intracranial vascular flow voids are maintained. Skull and upper cervical spine: Craniocervical junction within normal limits. Bone marrow signal intensity normal. No scalp soft tissue abnormality. Sinuses/Orbits: Globes and orbital soft tissues within normal limits. Mild scattered mucosal thickening noted within the ethmoidal air cells and maxillary sinuses. Paranasal sinuses are otherwise largely clear. Trace fluid signal intensity noted within the right mastoid air cells, of doubtful significance. Inner ear structures grossly normal. Other: None. MRA HEAD FINDINGS ANTERIOR CIRCULATION: Examination degraded by motion artifact. Visualized distal cervical segments of the internal carotid arteries are patent with antegrade flow. Petrous, cavernous, and supraclinoid segments patent without stenosis or other abnormality. A1 segments patent bilaterally. Grossly normal anterior communicating artery complex. Anterior cerebral arteries patent to their distal aspects without stenosis. No M1 stenosis or occlusion.  Grossly normal MCA bifurcations. Distal MCA branches well perfused and symmetric. POSTERIOR CIRCULATION: Visualized V4 segments widely patent to the vertebrobasilar junction. Both PICA origins patent and normal. Basilar patent to its distal aspect without stenosis. Superior cerebellar arteries patent bilaterally. Right PCA primarily supplied via the basilar, although a small right posterior communicating arteries noted. Hypoplastic left P1 segment and robust left posterior communicating artery supplies the left PCA. Both PCAs well perfused to their distal aspects. No intracranial aneurysm. IMPRESSION: MRI HEAD IMPRESSION: 1. Motion degraded exam. 2. No acute intracranial infarct or other abnormality. 3. Mild chronic microvascular ischemic disease for age. MRA HEAD IMPRESSION: 1. Technically limited exam due to motion  artifact. 2. Grossly negative intracranial MRA. No large vessel occlusion, hemodynamically significant stenosis, or other acute vascular abnormality. Electronically Signed   By: Jeannine Boga M.D.   On: 12/14/2020 18:06   CT HEAD CODE STROKE WO CONTRAST  Result Date: 12/14/2020 CLINICAL DATA:  Code stroke.  Left-sided weakness EXAM: CT HEAD WITHOUT CONTRAST TECHNIQUE: Contiguous axial images were obtained from the base of the skull through the vertex without intravenous contrast. COMPARISON:  11/13/2020 FINDINGS: Brain: No acute intracranial hemorrhage, mass effect, or edema. Gray-white differentiation is preserved. There is no extra-axial collection. Ventricles and sulci are stable in size and configuration. Vascular: No hyperdense vessel. There is intracranial atherosclerotic calcification at the skull base. Skull: Unremarkable. Sinuses/Orbits: Polypoid soft tissue within the nasal cavity. Mild paranasal sinus mucosal thickening. Orbits are unremarkable. Other: Mastoid air cells are clear. ASPECTS ([place] Stroke Program Early CT Score) - Ganglionic level infarction (caudate, lentiform  nuclei, internal capsule, insula, M1-M3 cortex): 7 - Supraganglionic infarction (M4-M6 cortex): 3 Total score (0-10 with 10 being normal): 10 IMPRESSION: There is no acute intracranial hemorrhage or evidence of acute infarction. ASPECT score is 10. Probable nasal polyposis. These results were communicated to Dr. Cheral Marker at 3:10 pm on 12/14/2020 by text page via the [facility] messaging system. Electronically Signed   By: Macy Mis M.D.   On: 12/14/2020 15:17    Medications:  Scheduled: . aspirin EC  81 mg Oral Daily  . atorvastatin  80 mg Oral Daily  . bisacodyl  5 mg Oral Daily  . clopidogrel  75 mg Oral Daily  . DULoxetine  30 mg Oral Daily  . Ferrous Fumarate  1 tablet Oral Daily  . heparin  5,000 Units Subcutaneous Q8H  . insulin aspart  0-5 Units Subcutaneous QHS  . insulin aspart  0-9 Units Subcutaneous TID WC  . insulin aspart  3 Units Subcutaneous TID WC  . insulin glargine  25 Units Subcutaneous BID  . isosorbide mononitrate  30 mg Oral Daily  . levothyroxine  88 mcg Oral QAC breakfast  . metoprolol  200 mg Oral BID  . [START ON 12/16/2020] pregabalin  75 mg Oral BID  . tamsulosin  0.4 mg Oral Daily   MRI HEAD IMPRESSION: 1. Motion degraded exam. 2. No acute intracranial infarct or other abnormality. 3. Mild chronic microvascular ischemic disease for age.  MRA HEAD IMPRESSION: 1. Technically limited exam due to motion artifact. 2. Grossly negative intracranial MRA. No large vessel occlusion, hemodynamically significant stenosis, or other acute vascular abnormality.  Assessment: 55 yo male who presents to ED via EMS today as a code stroke. LKW 12 noon. He was able to run errands and visit his MD in the morning. Shortly after noon, he became dizzy, felt his left side to be weak and had slurred speech. He was unable to ambulate without assistance. On arrival, his slurred speech had resolved but still with mild left sided weakness.  - MRI brain without acute abnormality. Mild  chronic microvascular ischemic changes are noted.  - MRA head with no LVO or hemodynamically significant stenosis  - Today's exam reveals asterixis/coarse tremors on RUE>LUE, none noted on lower extremities. Asterixis is significantly improved since yesterday.  - Renal impairment with elevated BUN and Cr. The patient states that an MD in [place] who evaluated him about 3 months ago was considering placement of a fistula initiation of dialysis.  - DDx for the patient's presentation includes acute stroke and uremia. Seizure is unlikely.  - Questionable compliance  with home medications.  - TSH normal. Lipid profile with elevated triglycerides. LDL is normal.  - Per Nephrology, the patient's CKD4 is a longstanding issu, most likely diabetic nephropathy but cardiorenal also on the DDx. Renal function stable to improved since 08/2020 Nephrology consult  Recommendations: - Recommened to discontinue Lyrica as it is possible cause of asterixis Karsten Ro, [place], [place] [facility], [place], [place]. Negative myoclonus induced by gabapentin and pregabalin: A case series and systematic literature review. J Neurol Sci. 2017 Nov 15;382:36-39. doi: [email or web address]/j.jns.2017.09.019. Epub 2017 Sep 18. PMID: [id number].) -TTE. -Continue atorvastatin - Continue aspirin 81 mg daily and clopidogrel 75 mg daily.   - Tight glucose control.  - SBP goal: Permissive hypertension first 24 hrs with goal < 220/110, then normalize.       Hold    home medications  for now. - Telemetry monitoring for arrhythmia. - BedsideSwallow screen. -Stroke education. -PT/OT/SLP consult. - Recommend metabolic/infectious workup with CBC, CMP, UA with UCx, CXR, CK, serum lactate - Neurology will sign off. Please call if there are additional questions.   Honor Junes, MD PGY-1, Resident    LOS: 1 day   @Electronically  signed: Dr. Kerney Elbe 12/15/2020  12:09 PM

## 2020-12-15 NOTE — ED Notes (Signed)
Patient resting quietly in bed at this time. No needs identified.

## 2020-12-15 NOTE — Progress Notes (Signed)
  Echocardiogram 2D Echocardiogram has been performed with Definity.  Kent Russell 12/15/2020, 2:02 PM

## 2020-12-15 NOTE — Plan of Care (Signed)
  Problem: Education: Goal: Knowledge of General Education information will improve Description: Including pain rating scale, medication(s)/side effects and non-pharmacologic comfort measures Outcome: Progressing   Problem: Pain Managment: Goal: General experience of comfort will improve Outcome: Progressing   Problem: Ischemic Stroke/TIA Tissue Perfusion: Goal: Complications of ischemic stroke/TIA will be minimized Outcome: Progressing

## 2020-12-15 NOTE — ED Notes (Signed)
Attempted report x 1; RN to call this RN back

## 2020-12-15 NOTE — ED Notes (Signed)
Pt BGL is 150. Awaiting meal tray prior to medicating with novolog.

## 2020-12-15 NOTE — ED Notes (Signed)
Called dietary response regarding patient's meal tray. Meal tray requested. Awaiting meal to arrive prior to administering overdue insulin.

## 2020-12-16 DIAGNOSIS — N179 Acute kidney failure, unspecified: Secondary | ICD-10-CM | POA: Diagnosis not present

## 2020-12-16 DIAGNOSIS — I5022 Chronic systolic (congestive) heart failure: Secondary | ICD-10-CM | POA: Diagnosis not present

## 2020-12-16 DIAGNOSIS — N1832 Chronic kidney disease, stage 3b: Secondary | ICD-10-CM | POA: Diagnosis not present

## 2020-12-16 DIAGNOSIS — I951 Orthostatic hypotension: Secondary | ICD-10-CM | POA: Diagnosis not present

## 2020-12-16 LAB — GLUCOSE, CAPILLARY
Glucose-Capillary: 113 mg/dL — ABNORMAL HIGH (ref 70–99)
Glucose-Capillary: 183 mg/dL — ABNORMAL HIGH (ref 70–99)
Glucose-Capillary: 107 mg/dL — ABNORMAL HIGH (ref 70–99)
Glucose-Capillary: 173 mg/dL — ABNORMAL HIGH (ref 70–99)

## 2020-12-16 LAB — BASIC METABOLIC PANEL
Anion gap: 9 (ref 5–15)
BUN: 31 mg/dL — ABNORMAL HIGH (ref 6–20)
CO2: 27 mmol/L (ref 22–32)
Calcium: 8.9 mg/dL (ref 8.9–10.3)
Chloride: 99 mmol/L (ref 98–111)
Creatinine, Ser: 2.88 mg/dL — ABNORMAL HIGH (ref 0.61–1.24)
GFR, Estimated: 25 mL/min — ABNORMAL LOW (ref >60)
Glucose, Bld: 130 mg/dL — ABNORMAL HIGH (ref 70–99)
Potassium: 3.4 mmol/L — ABNORMAL LOW (ref 3.5–5.1)
Sodium: 135 mmol/L (ref 135–145)

## 2020-12-16 LAB — PTH, INTACT AND CALCIUM
Calcium, Total (PTH): 9.5 mg/dL (ref 8.7–10.2)
PTH: 16 pg/mL (ref 15–65)

## 2020-12-16 MED ORDER — POTASSIUM CHLORIDE CRYS ER 20 MEQ PO TBCR
40.0000 meq | EXTENDED_RELEASE_TABLET | Freq: Two times a day (BID) | ORAL | Status: AC
  Administered 2020-12-16 (×2): 40 meq via ORAL
  Filled 2020-12-16 (×2): qty 2

## 2020-12-16 NOTE — TOC Transition Note (Addendum)
Transition of Care [facility]) - CM/SW Discharge Note   Patient Details  Name: Kent Russell MRN: [id number] Date of Birth: 02-18-1966  [facility]) CM/SW Contact:  Carles Collet, RN Phone Number: 12/16/2020, 3:42 PM   Clinical Narrative:    Damaris Schooner w patient at bedside. Discussed discharge plan. He would like to DC to home w home health services, PT and OT and DME listed below. He states that he is visiting town to see his 3 children and long term fiance. Patient states that he will be staying at [address] [place] [address] until his [facility] is complete and he has more strength and stability to be able to go back to [place].  He has RW, electric WC, and cane at her home, he needs rollator, and 3/1. He does not have a preference for [facility] services provider.  Patient has PCP in [place], will ask [facility] MD director on call to serve as PCP for [facility] services at time of DC.     Patient will need: Order for Home Health PT and OT with face to face. Order for rollator (4 wheeled RW with seat) Order for 3/1  Note stating days he was in the hospital.       Barriers to Discharge: Continued Medical Work up [facility] arrangements)   Patient Goals and CMS Choice Patient states their goals for this hospitalization and ongoing recovery are:: Pt states his goal is to return home to [place] and resume services.   Choice offered to / list presented to : Patient  Discharge Placement                       Discharge Plan and Services In-house Referral: Clinical Social Work   Post Acute Care Choice: Home Health                               Social Determinants of Health (SDOH) Interventions     Readmission Risk Interventions No flowsheet data found.

## 2020-12-16 NOTE — Progress Notes (Signed)
Occupational Therapy Treatment Note Pt issued tubing and AE to help with ADL tasks due to deficits from peripheral neuropathy. Pt very appreciative.     12/16/20 1000  OT Visit Information  Last OT Received On 12/16/20  Assistance Needed +1  History of Present Illness 55 yo male with onset of dizziness and weakness was admitted for medical care.  Pt is out of town visiting his fiancee, going to stay with her after this.  Has recent admit for AKI, urine retention and foly installed.  Now has LE weakness and speech change, but (-) MRI of head.  PMHx:  CAD, stenting, CHF, ischemic cardiomyopathy, EF 45%, CKD 4, HTN, DM, PN, cervical myelopathy, chronic anemia, BPH, CA, thyroid disease, transfusion  Precautions  Precautions Fall  Precaution Comments watch BP  Pain Assessment  Pain Assessment Faces  Faces Pain Scale 2  Pain Location nerve pain  Pain Descriptors / Indicators Burning  Pain Intervention(s) Limited activity within patient's tolerance  Cognition  Arousal/Alertness Awake/alert  Behavior During Therapy WFL for tasks assessed/performed  Overall Cognitive Status Within Functional Limits for tasks assessed  General Comments  General comments (skin integrity, edema, etc.) Educated pt about peripheral neruopathy - hadnout reviewed. Pt issued red, blue and beige tubing to assist wtih self feedingand grooming. tubing placed on utensils adn comb. Tubing cut and place around pen which helps pt tremendously be able to write. Issed elastic shoe strings as pt has difficulty tying shoes.  OT - End of Session  Activity Tolerance Patient tolerated treatment well  Patient left in chair;with call bell/phone within reach;with chair alarm set  Nurse Communication Mobility status;Other (comment)  OT Assessment/Plan  OT Plan Discharge plan remains appropriate  OT Visit Diagnosis Unsteadiness on feet (R26.81);Other abnormalities of gait and mobility (R26.89);History of falling (Z91.81);Muscle weakness  (generalized) (M62.81);Pain  Pain - part of body Hand;Ankle and joints of foot  OT Frequency (ACUTE ONLY) Min 2X/week  Follow Up Recommendations Home health OT;Outpatient OT;Supervision - Intermittent  OT Equipment 3 in 1 bedside commode;Other (comment)  AM-PAC OT "6 Clicks" Daily Activity Outcome Measure (Version 2)  Help from another person eating meals? 4  Help from another person taking care of personal grooming? 3  Help from another person toileting, which includes using toliet, bedpan, or urinal? 3  Help from another person bathing (including washing, rinsing, drying)? 3  Help from another person to put on and taking off regular upper body clothing? 3  Help from another person to put on and taking off regular lower body clothing? 3  6 Click Score 19  OT Goal Progression  Progress towards OT goals Progressing toward goals  Acute Rehab OT Goals  Patient Stated Goal to get stronger and go home  OT Goal Formulation With patient  Time For Goal Achievement 12/30/20  Potential to Achieve Goals Good  ADL Goals  Pt Will Transfer to Toilet with modified independence;ambulating  Additional ADL Goal #1 Pt will independently verbalize 3 strategies to reduce risk of falls  Additional ADL Goal #2 Pt will verbalize 2 strategies to recognize signs of low BP and implement counter-pressure exercises to reduce risk of falls  Additional ADL Goal #3 Pt will demonstrate understanding of AE to assist with ADL tasks duet o peripheral neuropathy  OT Time Calculation  OT Start Time (ACUTE ONLY) 0950  OT Stop Time (ACUTE ONLY) 1002  OT Time Calculation (min) 12 min  OT General Charges  $OT Visit 1 Visit  OT Treatments  $Self Care/Home  Management  8-22 mins  Maurie Boettcher, OT/L   Acute OT Clinical Specialist Acute Rehabilitation Services Pager [phone] Office [phone]

## 2020-12-16 NOTE — TOC Initial Note (Signed)
Transition of Care [facility]) - Initial/Assessment Note    Patient Details  Name: Oneill Bais MRN: [id number] Date of Birth: 07-15-1966  Transition of Care [facility]) CM/SW Contact:    Coralee Pesa, [place] Phone Number: 12/16/2020, 1:11 PM  Clinical Narrative:                 CSW spoke with pt about SNF vs. HH recommendations. Pt noted that in the past he has been told he is not elligible for SNF placement. He noted that he is from [place] and would like to return there as he has [facility] services there. He also asked for a letter stating he was in the hospital to give to his PCP and [facility] agency at home. CSW referred this pt to case management to assist with DC needs.  Expected Discharge Plan: [place] Barriers to Discharge: Continued Medical Work up [facility] arrangements)   Patient Goals and CMS Choice Patient states their goals for this hospitalization and ongoing recovery are:: Pt states his goal is to return home to [place] and resume services.   Choice offered to / list presented to : Patient  Expected Discharge Plan and Services Expected Discharge Plan: [place] In-house Referral: Clinical Social Work   Post Acute Care Choice: [place] arrangements for the past 2 months: [place]                                      Prior Living Arrangements/Services Living arrangements for the past 2 months: Single Family Home Lives with:: Significant Other          Need for Family Participation in Patient Care: Yes (Comment) Care giver support system in place?: Yes (comment) Current home services: Homehealth aide    Activities of Daily Living      Permission Sought/Granted Permission sought to share information with : Case Manager Permission granted to share information with : Yes, Verbal Permission Granted              Emotional Assessment Appearance:: Appears stated age Attitude/Demeanor/Rapport: Engaged Affect (typically  observed): Appropriate Orientation: : Oriented to Self,Oriented to Place,Oriented to  Time,Oriented to Situation Alcohol / Substance Use: Not Applicable Psych Involvement: No (comment)  Admission diagnosis:  Numbness [R20.0] Dizziness [R42] CVA (cerebral vascular accident) [facility]) [I63.9] Patient Active Problem List   Diagnosis Date Noted  . Diabetes mellitus type 2, insulin dependent ([place]) 12/15/2020  . AKI (acute kidney injury) ([place]) 12/15/2020  . CKD (chronic kidney disease), stage III ([place]) 12/15/2020  . Iron deficiency anemia due to chronic blood loss 12/15/2020  . Chronic systolic CHF (congestive heart failure) ([place]) 12/15/2020  . Essential hypertension 12/15/2020  . Orthostatic hypotension 12/14/2020  . CVA (cerebral vascular accident) ([place]) 12/14/2020   PCP:  Patient, No Pcp Per Pharmacy:   [id number] [place], [place] [address] [place] [place] [address] Phone: [phone] Fax: [phone]     Social Determinants of Health (SDOH) Interventions    Readmission Risk Interventions No flowsheet data found.

## 2020-12-16 NOTE — Progress Notes (Signed)
Occupational Therapy Evaluation Patient Details Name: Kent Russell MRN: [id number] DOB: [birth year] Today's Date: 12/16/2020    History of Present Illness 55 yo male with onset of dizziness and weakness was admitted for medical care.  Pt is out of town visiting his fiancee, going to stay with her after this.  Has recent admit for AKI, urine retention and foly installed.  Now has LE weakness and speech change, but (-) MRI of head.  PMHx:  CAD, stenting, CHF, ischemic cardiomyopathy, EF 45%, CKD 4, HTN, DM, PN, cervical myelopathy, chronic anemia, BPH, CA, thyroid disease, transfusion   Clinical Impression   Pt here from [place] visiting his fiance. Plan is to rehab here until he is strong enough to return to [place]. Pt able to ambulate @ 60 ft however became dizzy. Pt apparently orthostatic: Orthostatic BPs MD messaged. TEDS may help as well. Supine   Sitting 137/72  Sitting after 1 min 89/59  Return to sitting 124/70     Pt requires min A at this time for ADL and mobility @ RW level. Feel pt will progress to DC home. Initial follow up with HHOT/PT will be beneficial to maximize independence with mobility and ADL and reduce risk of falls given peripheral neuropathy.  No asterixis type movement observed during session. Pt educated on counter-pressure exercises to complete prior to standing and encouraged pt to spend increased time OOB in chair and complete sit - stand with nsg throughout the day.  Will follow acutely.    Follow Up Recommendations  Home health OT;Outpatient OT;Supervision - Intermittent (pending DC disposition)    Equipment Recommendations  3 in 1 bedside commode;Other (comment) (RW)    Recommendations for Other Services       Precautions / Restrictions Precautions Precautions: Fall Precaution Comments: watch BP      Mobility Bed Mobility               General bed mobility comments: OOB in chair    Transfers Overall transfer level: Needs assistance    Transfers: Sit to/from Stand Sit to Stand: Supervision         General transfer comment: increased assistance due to dizziness    Balance Overall balance assessment: Needs assistance   Sitting balance-Leahy Scale: Good       Standing balance-Leahy Scale: Poor Standing balance comment: reliant on external support                           ADL either performed or assessed with clinical judgement   ADL Overall ADL's : Needs assistance/impaired Eating/Feeding: Modified independent Eating/Feeding Details (indicate cue type and reason): drops utensils; difficulty due to neuropathy Grooming: Set up   Upper Body Bathing: Set up;Sitting   Lower Body Bathing: Min guard;Sit to/from stand   Upper Body Dressing : Set up;Sitting   Lower Body Dressing: Min guard;Sit to/from stand   Toilet Transfer: Min guard;Ambulation;RW   Toileting- Clothing Manipulation and Hygiene: Supervision/safety       Functional mobility during ADLs: Minimal assistance (due to dizziness)       Vision Baseline Vision/History: Wears glasses Wears Glasses: Reading only       Perception     Praxis      Pertinent Vitals/Pain Pain Assessment: 0-10 Pain Score: 2  Pain Location: nerve pain Pain Descriptors / Indicators: Burning Pain Intervention(s): Limited activity within patient's tolerance     Hand Dominance Right   Extremity/Trunk Assessment Upper Extremity Assessment Upper Extremity  Assessment: Overall WFL for tasks assessed (hx peripheral neuropathy B hands with intrinisic wasting)   Lower Extremity Assessment Lower Extremity Assessment: Defer to PT evaluation (PN)   Cervical / Trunk Assessment Cervical / Trunk Assessment: Normal   Communication Communication Communication: No difficulties   Cognition Arousal/Alertness: Awake/alert Behavior During Therapy: WFL for tasks assessed/performed Overall Cognitive Status: Within Functional Limits for tasks assessed                                      General Comments       Exercises Other Exercises Other Exercises: encouraged counter-pressure exercises before standing Other Exercises: complete sit - stand with nsg; encouraged OOB in chair as much as tolerated   Shoulder Instructions      Home Living Family/patient expects to be discharged to:: Private residence Living Arrangements: Spouse/significant other Available Help at Discharge: Family;Available 24 hours/day (Fiance - can help with meal prep/etc) Type of Home: House Home Access: Stairs to enter [employer] of Steps: 3+1 Entrance Stairs-Rails: Right Home Layout: One level     Bathroom Shower/Tub: Tub/shower unit;Curtain   Bathroom Toilet: Standard Bathroom Accessibility: No   Home Equipment: Environmental consultant - 2 wheels;Wheelchair - power (Has access to a regular walker and power wc)          Prior Functioning/Environment Level of Independence: Independent with assistive device(s)        Comments: occasionally used a cane when walking to the mailbox; uses power wc outside; uses motorized scooter form other places when out in the community        OT Problem List: Decreased strength;Decreased activity tolerance;Impaired balance (sitting and/or standing);Decreased coordination;Decreased safety awareness;Decreased knowledge of use of DME or AE;Impaired sensation;Pain;Impaired UE functional use      OT Treatment/Interventions: Self-care/ADL training;Therapeutic exercise;Neuromuscular education;Energy conservation;DME and/or AE instruction;Therapeutic activities;Patient/family education;Balance training    OT Goals(Current goals can be found in the care plan section) Acute Rehab OT Goals Patient Stated Goal: to get stronger and go home OT Goal Formulation: With patient Time For Goal Achievement: 12/30/20 Potential to Achieve Goals: Good  OT Frequency: Min 2X/week   Barriers to D/C:            Co-evaluation               AM-PAC OT "6 Clicks" Daily Activity     Outcome Measure Help from another person eating meals?: A Little Help from another person taking care of personal grooming?: A Little Help from another person toileting, which includes using toliet, bedpan, or urinal?: A Little Help from another person bathing (including washing, rinsing, drying)?: A Little Help from another person to put on and taking off regular upper body clothing?: A Little Help from another person to put on and taking off regular lower body clothing?: A Little 6 Click Score: 18   End of Session Equipment Utilized During Treatment: Gait belt;Rolling walker Nurse Communication: Mobility status;Other (comment) (orhtostatic)  Activity Tolerance: Patient tolerated treatment well Patient left: in chair;with call bell/phone within reach;with chair alarm set  OT Visit Diagnosis: Unsteadiness on feet (R26.81);Other abnormalities of gait and mobility (R26.89);History of falling (Z91.81);Muscle weakness (generalized) (M62.81);Pain Pain - Right/Left:  (B) Pain - part of body: Hand;Ankle and joints of foot                Time: 0938-1829 OT Time Calculation (min): 30 min Charges:  OT General Charges $OT  Visit: 1 Visit OT Evaluation $OT Eval Moderate Complexity: 1 Mod OT Treatments $Self Care/Home Management : 8-22 mins  Maurie Boettcher, OT/L   Acute OT Clinical Specialist Acute Rehabilitation Services Pager [phone] Office [phone]   [facility] 12/16/2020, 9:28 AM

## 2020-12-16 NOTE — Plan of Care (Signed)
  Problem: Activity: Goal: Risk for activity intolerance will decrease Outcome: Progressing   Problem: Coping: Goal: Level of anxiety will decrease Outcome: Progressing   Problem: Safety: Goal: Ability to remain free from injury will improve Outcome: Progressing   Problem: Ischemic Stroke/TIA Tissue Perfusion: Goal: Complications of ischemic stroke/TIA will be minimized Outcome: Progressing

## 2020-12-16 NOTE — Progress Notes (Signed)
[facility] PROGRESS NOTE    Progress Note  Zebedee Segundo  QIO:[id number] DOB: Oct 24, 1966 DOA: 12/14/2020 PCP: Patient, No Pcp Per     Brief Narrative:   Esaias Cleavenger is an 55 y.o. male past medical history significant for CAD status post stenting, chronic systolic heart failure secondary to ischemic cardiomyopathy with an EF of 45%, chronic kidney disease stage IV, essential hypertension insulin-dependent diabetes mellitus chronic iron deficiency anemia, recently admitted to [facility] about 2 weeks prior to admission for acute kidney injury due to acute urinary retention a Foley was placed, started having bilateral lower extremity weakness and wobbly gait associated with significant dizziness  Significant studies: 12/14/2020 CT of the head showed no acute findings. 12/15/2020 MRI of the brain showed motion dehydrated, no acute intracranial abnormalities, mild chronic microvascular ischemic changes 2022: MRA of the of the head showed no acute obvious stenosis.  Antibiotics: None  Microbiology data: Blood culture:  Procedures:  Assessment/Plan:   Lightheadedness/orthostatic hypotension: Orthostatics were positive continue to hold diuretics. He still relates some dizziness upon standing which is much improved compared to yesterday. MRI of the brain is negative.  Neurology recommended to discontinue Lyrica 6 could cause asterixis. Recommended to continue Lipitor, aspirin and Plavix. He is not having symptoms of a stroke his neurological exam is intact, MRI of the brain is negative.  Acute kidney injury on chronic kidney disease stage IIIb: With a baseline creatinine around 2.5 on admission 3.2 slowly improving with IV fluid hydration and holding diuretics. Continue to hold diuretics, will KVO IV fluid to allow oral hydration.  Chronic systolic heart failure secondary to ischemic cardiomyopathy: KVO IV fluids continue to hold diuretic therapy, his creatinine is slowly  improving. Continue to hold ARB continue hydralazine and Imdur. Definitely hypovolemic in the setting of acute kidney injury. Blood pressure is well controlled. He will probably need a lower dose of Bumex as an outpatient.  Tremors: Now resolved UDS is negative.  0.3. Discontinue Lyrica.  Chronic iron deficiency anemia: Hemoglobin stable.  Essential hypertension: Continue metoprolol hydralazine and Imdur blood pressure is improved.  Hold Entresto.  Insulin-dependent diabetes mellitus type 2: With an A1c of 7.1, continue long-acting insulin per sliding scale.  History of cervical myelopathy. Stable will need to follow-up at [facility] an outpatient.  Hypo-Kalemia: Replete orally recheck in the morning.   DVT prophylaxis: lovenox Family Communication:none Status is: Inpatient  Remains inpatient appropriate because:Hemodynamically unstable   Dispo: The patient is from: Home              Anticipated d/c is to: Home              Anticipated d/c date is: 2 days              Patient currently is not medically stable to d/c.   Difficult to place patient No        Code Status:     Code Status Orders  (From admission, onward)         Start     Ordered   12/14/20 1741  Full code  Continuous        12/14/20 1743        Code Status History    This patient has a current code status but no historical code status.   Advance Care Planning Activity        IV Access:    Peripheral IV   Procedures and diagnostic studies:   MR ANGIO HEAD WO CONTRAST  Result Date: 12/14/2020 CLINICAL DATA:  Initial evaluation for neuro deficit, stroke suspected. Weakness, speech abnormality. EXAM: MRI HEAD WITHOUT CONTRAST MRA HEAD WITHOUT CONTRAST TECHNIQUE: Multiplanar, multiecho pulse sequences of the brain and surrounding structures were obtained without intravenous contrast. Angiographic images of the head were obtained using MRA technique without contrast. COMPARISON:  Prior  head CT from earlier the same day as well as recent brain MRI from 11/16/2020. FINDINGS: MRI HEAD FINDINGS Brain: Examination degraded by motion artifact. Cerebral volume within normal limits for age. Patchy T2/FLAIR hyperintensity within the periventricular and deep white matter both cerebral hemispheres most consistent with chronic small vessel ischemic disease, mild in nature. No abnormal foci of restricted diffusion to suggest acute or subacute ischemia. Gray-white matter differentiation maintained. No encephalomalacia to suggest chronic cortical infarction. No foci of susceptibility artifact to suggest acute or chronic intracranial hemorrhage. No mass lesion, midline shift or mass effect. No hydrocephalus or extra-axial fluid collection. Pituitary gland suprasellar region normal. Midline structures intact. Vascular: Major intracranial vascular flow voids are maintained. Skull and upper cervical spine: Craniocervical junction within normal limits. Bone marrow signal intensity normal. No scalp soft tissue abnormality. Sinuses/Orbits: Globes and orbital soft tissues within normal limits. Mild scattered mucosal thickening noted within the ethmoidal air cells and maxillary sinuses. Paranasal sinuses are otherwise largely clear. Trace fluid signal intensity noted within the right mastoid air cells, of doubtful significance. Inner ear structures grossly normal. Other: None. MRA HEAD FINDINGS ANTERIOR CIRCULATION: Examination degraded by motion artifact. Visualized distal cervical segments of the internal carotid arteries are patent with antegrade flow. Petrous, cavernous, and supraclinoid segments patent without stenosis or other abnormality. A1 segments patent bilaterally. Grossly normal anterior communicating artery complex. Anterior cerebral arteries patent to their distal aspects without stenosis. No M1 stenosis or occlusion. Grossly normal MCA bifurcations. Distal MCA branches well perfused and symmetric.  POSTERIOR CIRCULATION: Visualized V4 segments widely patent to the vertebrobasilar junction. Both PICA origins patent and normal. Basilar patent to its distal aspect without stenosis. Superior cerebellar arteries patent bilaterally. Right PCA primarily supplied via the basilar, although a small right posterior communicating arteries noted. Hypoplastic left P1 segment and robust left posterior communicating artery supplies the left PCA. Both PCAs well perfused to their distal aspects. No intracranial aneurysm. IMPRESSION: MRI HEAD IMPRESSION: 1. Motion degraded exam. 2. No acute intracranial infarct or other abnormality. 3. Mild chronic microvascular ischemic disease for age. MRA HEAD IMPRESSION: 1. Technically limited exam due to motion artifact. 2. Grossly negative intracranial MRA. No large vessel occlusion, hemodynamically significant stenosis, or other acute vascular abnormality. Electronically Signed   By: Jeannine Boga M.D.   On: 12/14/2020 18:06   MR BRAIN WO CONTRAST  Result Date: 12/14/2020 CLINICAL DATA:  Initial evaluation for neuro deficit, stroke suspected. Weakness, speech abnormality. EXAM: MRI HEAD WITHOUT CONTRAST MRA HEAD WITHOUT CONTRAST TECHNIQUE: Multiplanar, multiecho pulse sequences of the brain and surrounding structures were obtained without intravenous contrast. Angiographic images of the head were obtained using MRA technique without contrast. COMPARISON:  Prior head CT from earlier the same day as well as recent brain MRI from 11/16/2020. FINDINGS: MRI HEAD FINDINGS Brain: Examination degraded by motion artifact. Cerebral volume within normal limits for age. Patchy T2/FLAIR hyperintensity within the periventricular and deep white matter both cerebral hemispheres most consistent with chronic small vessel ischemic disease, mild in nature. No abnormal foci of restricted diffusion to suggest acute or subacute ischemia. Gray-white matter differentiation maintained. No  encephalomalacia to suggest chronic cortical infarction. No foci  of susceptibility artifact to suggest acute or chronic intracranial hemorrhage. No mass lesion, midline shift or mass effect. No hydrocephalus or extra-axial fluid collection. Pituitary gland suprasellar region normal. Midline structures intact. Vascular: Major intracranial vascular flow voids are maintained. Skull and upper cervical spine: Craniocervical junction within normal limits. Bone marrow signal intensity normal. No scalp soft tissue abnormality. Sinuses/Orbits: Globes and orbital soft tissues within normal limits. Mild scattered mucosal thickening noted within the ethmoidal air cells and maxillary sinuses. Paranasal sinuses are otherwise largely clear. Trace fluid signal intensity noted within the right mastoid air cells, of doubtful significance. Inner ear structures grossly normal. Other: None. MRA HEAD FINDINGS ANTERIOR CIRCULATION: Examination degraded by motion artifact. Visualized distal cervical segments of the internal carotid arteries are patent with antegrade flow. Petrous, cavernous, and supraclinoid segments patent without stenosis or other abnormality. A1 segments patent bilaterally. Grossly normal anterior communicating artery complex. Anterior cerebral arteries patent to their distal aspects without stenosis. No M1 stenosis or occlusion. Grossly normal MCA bifurcations. Distal MCA branches well perfused and symmetric. POSTERIOR CIRCULATION: Visualized V4 segments widely patent to the vertebrobasilar junction. Both PICA origins patent and normal. Basilar patent to its distal aspect without stenosis. Superior cerebellar arteries patent bilaterally. Right PCA primarily supplied via the basilar, although a small right posterior communicating arteries noted. Hypoplastic left P1 segment and robust left posterior communicating artery supplies the left PCA. Both PCAs well perfused to their distal aspects. No intracranial aneurysm.  IMPRESSION: MRI HEAD IMPRESSION: 1. Motion degraded exam. 2. No acute intracranial infarct or other abnormality. 3. Mild chronic microvascular ischemic disease for age. MRA HEAD IMPRESSION: 1. Technically limited exam due to motion artifact. 2. Grossly negative intracranial MRA. No large vessel occlusion, hemodynamically significant stenosis, or other acute vascular abnormality. Electronically Signed   By: Jeannine Boga M.D.   On: 12/14/2020 18:06   ECHOCARDIOGRAM COMPLETE  Result Date: 12/15/2020    ECHOCARDIOGRAM REPORT   Patient Name:   ALF DOYLE Date of Exam: 12/15/2020 Medical Rec #:  [id number]      Height:       68.0 in Accession #:    [id number]     Weight:       186.9 lb Date of Birth:  Jan 15, 1966      BSA:          1.986 m Patient Age:    7 years       BP:           158/78 mmHg Patient Gender: M              HR:           72 bpm. Exam Location:  Inpatient Procedure: 2D Echo, Cardiac Doppler, Color Doppler and Intracardiac            Opacification Agent                                 MODIFIED REPORT:     This report was modified by Lyman Bishop MD on 12/15/2020 due to Updated               comparison with recent echo in 11/2020 from [place].  Indications:     TIA  History:         Patient has no prior history of Echocardiogram examinations.                  CHF; Risk  Factors:Hypertension. Ischemic cardiomyopathy. CKD.  Sonographer:     Clayton Lefort RDCS (AE) Referring Phys:  [id number] Lequita Halt Diagnosing Phys: Lyman Bishop MD IMPRESSIONS  1. No LV apical thrombus. Left ventricular ejection fraction, by estimation, is 50 to 55%. The left ventricle has low normal function. The left ventricle demonstrates regional wall motion abnormalities (see scoring diagram/findings for description). There is moderate left ventricular hypertrophy. Left ventricular diastolic parameters are consistent with Grade II diastolic dysfunction (pseudonormalization). Elevated left ventricular end-diastolic pressure.  There is moderate hypokinesis of the left ventricular, basal inferior wall and inferolateral wall.  2. Right ventricular systolic function is normal. The right ventricular size is normal. There is moderately elevated pulmonary artery systolic pressure. The estimated right ventricular systolic pressure is 81.2 mmHg.  3. Left atrial size was moderately dilated.  4. The mitral valve is abnormal. Mild mitral valve regurgitation.  5. The aortic valve is tricuspid. Aortic valve regurgitation is not visualized. Mild aortic valve sclerosis is present, with no evidence of aortic valve stenosis. Aortic valve mean gradient measures 5.0 mmHg.  6. Aortic dilatation noted. There is mild dilatation of the aortic root, measuring 40 mm. There is borderline dilatation of the ascending aorta, measuring 38 mm.  7. The inferior vena cava is dilated in size with >50% respiratory variability, suggesting right atrial pressure of 8 mmHg. Comparison(s): Prior images unable to be directly viewed, comparison made by report only. Echo from 11/13/2020 [facility]): LVEF 50-55%, inferolateral infarct. Conclusion(s)/Recommendation(s): No intracardiac source of embolism detected on this transthoracic study. A transesophageal echocardiogram is recommended to exclude cardiac source of embolism if clinically indicated. FINDINGS  Left Ventricle: No LV apical thrombus. Left ventricular ejection fraction, by estimation, is 50 to 55%. The left ventricle has low normal function. The left ventricle demonstrates regional wall motion abnormalities. Moderate hypokinesis of the left ventricular, basal inferior wall and inferolateral wall. Definity contrast agent was given IV to delineate the left ventricular endocardial borders. The left ventricular internal cavity size was normal in size. There is moderate left ventricular hypertrophy. Left ventricular diastolic parameters are consistent with Grade II diastolic dysfunction (pseudonormalization). Elevated left  ventricular end-diastolic pressure. Right Ventricle: The right ventricular size is normal. No increase in right ventricular wall thickness. Right ventricular systolic function is normal. There is moderately elevated pulmonary artery systolic pressure. The tricuspid regurgitant velocity is 3.54 m/s, and with an assumed right atrial pressure of 8 mmHg, the estimated right ventricular systolic pressure is 75.1 mmHg. Left Atrium: Left atrial size was moderately dilated. Right Atrium: Right atrial size was normal in size. Pericardium: There is no evidence of pericardial effusion. Mitral Valve: The mitral valve is abnormal. There is mild thickening of the mitral valve leaflet(s). Mild mitral valve regurgitation. MV peak gradient, 7.7 mmHg. The mean mitral valve gradient is 3.0 mmHg. Tricuspid Valve: The tricuspid valve is grossly normal. Tricuspid valve regurgitation is mild. Aortic Valve: The aortic valve is tricuspid. Aortic valve regurgitation is not visualized. Mild aortic valve sclerosis is present, with no evidence of aortic valve stenosis. Aortic valve mean gradient measures 5.0 mmHg. Aortic valve peak gradient measures 8.4 mmHg. Aortic valve area, by VTI measures 3.11 cm. Pulmonic Valve: The pulmonic valve was grossly normal. Pulmonic valve regurgitation is not visualized. Aorta: Aortic dilatation noted. There is mild dilatation of the aortic root, measuring 40 mm. There is borderline dilatation of the ascending aorta, measuring 38 mm. Venous: The inferior vena cava is dilated in size with greater than 50% respiratory variability, suggesting  right atrial pressure of 8 mmHg. IAS/Shunts: No atrial level shunt detected by color flow Doppler.  LEFT VENTRICLE PLAX 2D LVIDd:         5.80 cm  Diastology LVIDs:         4.40 cm  LV e' medial:    5.33 cm/s LV PW:         1.50 cm  LV E/e' medial:  28.9 LV IVS:        1.30 cm  LV e' lateral:   8.55 cm/s LVOT diam:     2.60 cm  LV E/e' lateral: 18.0 LV SV:         89 LV SV  Index:   45 LVOT Area:     5.31 cm  RIGHT VENTRICLE             IVC RV Basal diam:  3.50 cm     IVC diam: 2.10 cm RV Mid diam:    3.30 cm RV S prime:     12.00 cm/s TAPSE (M-mode): 1.4 cm LEFT ATRIUM             Index       RIGHT ATRIUM           Index LA diam:        4.10 cm 2.06 cm/m  RA Area:     17.70 cm LA Vol (A2C):   81.7 ml 41.14 ml/m RA Volume:   51.90 ml  26.13 ml/m LA Vol (A4C):   81.9 ml 41.24 ml/m LA Biplane Vol: 83.9 ml 42.25 ml/m  AORTIC VALVE AV Area (Vmax):    3.23 cm AV Area (Vmean):   2.86 cm AV Area (VTI):     3.11 cm AV Vmax:           145.00 cm/s AV Vmean:          105.000 cm/s AV VTI:            0.287 m AV Peak Grad:      8.4 mmHg AV Mean Grad:      5.0 mmHg LVOT Vmax:         88.10 cm/s LVOT Vmean:        56.500 cm/s LVOT VTI:          0.168 m LVOT/AV VTI ratio: 0.59  AORTA Ao Root diam: 4.00 cm Ao Asc diam:  3.80 cm MITRAL VALVE                TRICUSPID VALVE MV Area (PHT): 3.77 cm     TR Peak grad:   50.1 mmHg MV Area VTI:   2.11 cm     TR Vmax:        354.00 cm/s MV Peak grad:  7.7 mmHg MV Mean grad:  3.0 mmHg     SHUNTS MV Vmax:       1.39 m/s     Systemic VTI:  0.17 m MV Vmean:      82.7 cm/s    Systemic Diam: 2.60 cm MV Decel Time: 201 msec MR Peak grad: 127.7 mmHg MR Mean grad: 82.0 mmHg MR Vmax:      565.00 cm/s MR Vmean:     430.0 cm/s MV E velocity: 154.00 cm/s MV A velocity: 120.00 cm/s MV E/A ratio:  1.28 Lyman Bishop MD Electronically signed by Lyman Bishop MD Signature Date/Time: 12/15/2020/2:56:40 PM    Final (Updated)    CT HEAD CODE STROKE WO CONTRAST  Result Date: 12/14/2020  CLINICAL DATA:  Code stroke.  Left-sided weakness EXAM: CT HEAD WITHOUT CONTRAST TECHNIQUE: Contiguous axial images were obtained from the base of the skull through the vertex without intravenous contrast. COMPARISON:  11/13/2020 FINDINGS: Brain: No acute intracranial hemorrhage, mass effect, or edema. Gray-white differentiation is preserved. There is no extra-axial collection.  Ventricles and sulci are stable in size and configuration. Vascular: No hyperdense vessel. There is intracranial atherosclerotic calcification at the skull base. Skull: Unremarkable. Sinuses/Orbits: Polypoid soft tissue within the nasal cavity. Mild paranasal sinus mucosal thickening. Orbits are unremarkable. Other: Mastoid air cells are clear. ASPECTS ([place] Stroke Program Early CT Score) - Ganglionic level infarction (caudate, lentiform nuclei, internal capsule, insula, M1-M3 cortex): 7 - Supraganglionic infarction (M4-M6 cortex): 3 Total score (0-10 with 10 being normal): 10 IMPRESSION: There is no acute intracranial hemorrhage or evidence of acute infarction. ASPECT score is 10. Probable nasal polyposis. These results were communicated to Dr. Cheral Marker at 3:10 pm on 12/14/2020 by text page via the [facility] messaging system. Electronically Signed   By: Macy Mis M.D.   On: 12/14/2020 15:17     Medical Consultants:    None.   Subjective:    Nichael Ehly relates his dizziness upon standing is significantly improved compared to yesterday he still has it occasionally and his last for shorter period of time.  Objective:    Vitals:   12/15/20 1949 12/15/20 2344 12/16/20 0351 12/16/20 0824  BP: (!) 141/77 134/80 (!) 149/86 (!) 129/103  Pulse: 74 74 71 69  Resp:    20  Temp: 98.2 F (36.8 C) 98.8 F (37.1 C) 98.4 F (36.9 C) 98.3 F (36.8 C)  TempSrc: Oral Oral Oral Oral  SpO2:  95% 95% 97%  Weight:      Height:       SpO2: 97 %   Intake/Output Summary (Last 24 hours) at 12/16/2020 0848 Last data filed at 12/15/2020 2031 Gross per 24 hour  Intake --  Output 200 ml  Net -200 ml   Filed Weights   12/14/20 1500 12/14/20 1605  Weight: 84.8 kg 84.8 kg    Exam: General exam: In no acute distress. Respiratory system: Good air movement and clear to auscultation. Cardiovascular system: S1 & S2 heard, RRR. No JVD. Gastrointestinal system: Abdomen is nondistended, soft and  nontender.  Extremities: No pedal edema. Skin: No rashes, lesions or ulcers Psychiatry: Judgement and insight appear normal. Mood & affect appropriate.   Data Reviewed:    Labs: Basic Metabolic Panel: Recent Labs  Lab 12/14/20 1502 12/14/20 1511 12/15/20 0228 12/16/20 0537  NA 133* 134*  --  135  K 4.0 3.9  --  3.4*  CL 96* 93*  --  99  CO2 27  --   --  27  GLUCOSE 228* 229*  --  130*  BUN 37* 36*  --  31*  CREATININE 3.23* 3.10*  --  2.88*  CALCIUM 8.6*  --  9.5 8.9   GFR Estimated Creatinine Clearance: 30.7 mL/min (A) (by C-G formula based on SCr of 2.88 mg/dL (H)). Liver Function Tests: Recent Labs  Lab 12/14/20 1502  AST 16  ALT 15  ALKPHOS 36*  BILITOT 0.4  PROT 5.6*  ALBUMIN 2.7*   No results for input(s): LIPASE, AMYLASE in the last 168 hours. Recent Labs  Lab 12/14/20 1836  AMMONIA 15   Coagulation profile Recent Labs  Lab 12/14/20 1502  INR 1.2   COVID-19 Labs  Recent Labs    12/14/20 1836  FERRITIN 74    Lab Results  Component Value Date   [place] NEGATIVE 12/15/2020    CBC: Recent Labs  Lab 12/14/20 1502 12/14/20 1511  WBC 10.1  --   NEUTROABS 8.3*  --   HGB 7.9* 8.5*  HCT 25.0* 25.0*  MCV 92.9  --   PLT 264  --    Cardiac Enzymes: No results for input(s): CKTOTAL, CKMB, CKMBINDEX, TROPONINI in the last 168 hours. BNP (last 3 results) No results for input(s): PROBNP in the last 8760 hours. CBG: Recent Labs  Lab 12/15/20 1147 12/15/20 1736 12/15/20 1952 12/15/20 2347 12/16/20 0354  GLUCAP 129* 136* 169* 141* 113*   D-Dimer: No results for input(s): DDIMER in the last 72 hours. Hgb A1c: Recent Labs    12/15/20 0228  HGBA1C 7.1*   Lipid Profile: Recent Labs    12/15/20 0228  CHOL 94  HDL 19*  LDLCALC 38  TRIG 186*  CHOLHDL 4.9   Thyroid function studies: Recent Labs    12/14/20 1836  TSH 3.313   Anemia work up: Recent Labs    12/14/20 1836  FERRITIN 74  TIBC 248*  IRON 30*  RETICCTPCT  2.6   Sepsis Labs: Recent Labs  Lab 12/14/20 1502  WBC 10.1   Microbiology Recent Results (from the past 240 hour(s))  SARS CORONAVIRUS 2 (TAT 6-24 HRS) Nasopharyngeal Nasopharyngeal Swab     Status: None   Collection Time: 12/15/20  3:55 AM   Specimen: Nasopharyngeal Swab  Result Value Ref Range Status   SARS Coronavirus 2 NEGATIVE NEGATIVE Final    Comment: (NOTE) SARS-CoV-2 target nucleic acids are NOT DETECTED.  The SARS-CoV-2 RNA is generally detectable in upper and lower respiratory specimens during the acute phase of infection. Negative results do not preclude SARS-CoV-2 infection, do not rule out co-infections with other pathogens, and should not be used as the sole basis for treatment or other patient management decisions. Negative results must be combined with clinical observations, patient history, and epidemiological information. The expected result is Negative.  Fact Sheet for Patients: SugarRoll.be  Fact Sheet for Healthcare Providers: https://www.woods-mathews.com/  This test is not yet approved or cleared by the [place] FDA and  has been authorized for detection and/or diagnosis of SARS-CoV-2 by FDA under an Emergency Use Authorization (EUA). This EUA will remain  in effect (meaning this test can be used) for the duration of the COVID-19 declaration under Se ction 564(b)(1) of the Act, 21 U.S.C. section 360bbb-3(b)(1), unless the authorization is terminated or revoked sooner.  Performed at [facility], [place] [address], [place], [place] [address]      Medications:   . aspirin EC  81 mg Oral Daily  . atorvastatin  80 mg Oral Daily  . bisacodyl  5 mg Oral Daily  . clopidogrel  75 mg Oral Daily  . DULoxetine  30 mg Oral Daily  . Ferrous Fumarate  1 tablet Oral Daily  . heparin  5,000 Units Subcutaneous Q8H  . insulin aspart  0-5 Units Subcutaneous QHS  . insulin aspart  0-9 Units Subcutaneous TID  WC  . insulin aspart  3 Units Subcutaneous TID WC  . insulin glargine  25 Units Subcutaneous BID  . isosorbide mononitrate  30 mg Oral Daily  . levothyroxine  88 mcg Oral QAC breakfast  . metoprolol  200 mg Oral BID  . pregabalin  75 mg Oral BID  . tamsulosin  0.4 mg Oral Daily   Continuous Infusions:  LOS: 2 days   Charlynne Cousins  [facility]  12/16/2020, 8:48 AM

## 2020-12-16 NOTE — Evaluation (Signed)
Speech Language Pathology Evaluation Patient Details Name: Kent Russell MRN: [id number] DOB: 10-03-1966 Today's Date: 12/16/2020 Time: 0932-3557 SLP Time Calculation (min) (ACUTE ONLY): 12 min  Problem List:  Patient Active Problem List   Diagnosis Date Noted  . Diabetes mellitus type 2, insulin dependent ([place]) 12/15/2020  . AKI (acute kidney injury) ([place]) 12/15/2020  . CKD (chronic kidney disease), stage III ([place]) 12/15/2020  . Iron deficiency anemia due to chronic blood loss 12/15/2020  . Chronic systolic CHF (congestive heart failure) ([place]) 12/15/2020  . Essential hypertension 12/15/2020  . Orthostatic hypotension 12/14/2020  . CVA (cerebral vascular accident) ([place]) 12/14/2020   Past Medical History:  Past Medical History:  Diagnosis Date  . Blood transfusion without reported diagnosis   . Cancer ([place])   . CHF (congestive heart failure) ([place])   . Diabetes mellitus without complication ([place])   . Hypertension   . Renal disorder   . Thyroid disease    Past Surgical History:  Past Surgical History:  Procedure Laterality Date  . ABDOMINAL SURGERY    . COLON SURGERY    . EYE SURGERY     HPI:  55 yo male with onset of dizziness and weakness was admitted for medical care.  Pt is out of town visiting his fiancee, going to stay with her after this.  Has recent admit for AKI, urine retention and foly installed.  Now has LE weakness and speech change, but (-) MRI of head.  PMHx:  CAD, stenting, CHF, ischemic cardiomyopathy, EF 45%, CKD 4, HTN, DM, PN, cervical myelopathy, chronic anemia, BPH, CA, thyroid disease, transfusion   Assessment / Plan / Recommendation Clinical Impression   Pt presents with mild cognitive deficits, most specifically related to decreased retrieval of information.  Pt reports this is new and not consistent with his baseline.  Pt scored 18/24 on the [place] Exam (full assessment not administered due to poor vision and glasses not  available) with points missed on delayed recall and backwards digit repetition subtests, both of which support pt's reports of memory changes.  Pt also endorses new word finding difficulty although this was not evident during today's evaluation.  Given the abovementioned deficits, pt would benefit from skilled ST while inpatient in order to maximize functional independence and reduce burden of care prior to discharge.      SLP Assessment  SLP Recommendation/Assessment: Patient needs continued Speech Lanaguage Pathology Services SLP Visit Diagnosis: Cognitive communication deficit (R41.841)    Follow Up Recommendations  Home health SLP    Frequency and Duration min 1 x/week         SLP Evaluation Cognition  Overall Cognitive Status: Impaired/Different from baseline Arousal/Alertness: Awake/alert Orientation Level: Oriented X4 Attention: Sustained Sustained Attention: Appears intact Memory: Impaired Memory Impairment: Retrieval deficit Awareness: Appears intact Safety/Judgment: Appears intact       Comprehension  Auditory Comprehension Overall Auditory Comprehension: Appears within functional limits for tasks assessed    Expression Expression Primary Mode of Expression: Verbal Verbal Expression Overall Verbal Expression: Appears within functional limits for tasks assessed Other Verbal Expression Comments: Pt reports word finding difficulty but not witnessed during eval Written Expression Dominant Hand: Right   Oral / Motor  Oral Motor/Sensory Function Overall Oral Motor/Sensory Function: Within functional limits Motor Speech Overall Motor Speech: Appears within functional limits for tasks assessed   GO                    PageSelinda Orion 12/16/2020, 11:07  AM    

## 2020-12-17 DIAGNOSIS — N179 Acute kidney failure, unspecified: Secondary | ICD-10-CM | POA: Diagnosis not present

## 2020-12-17 DIAGNOSIS — N1832 Chronic kidney disease, stage 3b: Secondary | ICD-10-CM | POA: Diagnosis not present

## 2020-12-17 DIAGNOSIS — I951 Orthostatic hypotension: Secondary | ICD-10-CM | POA: Diagnosis not present

## 2020-12-17 DIAGNOSIS — I5022 Chronic systolic (congestive) heart failure: Secondary | ICD-10-CM | POA: Diagnosis not present

## 2020-12-17 LAB — BASIC METABOLIC PANEL
Anion gap: 11 (ref 5–15)
BUN: 33 mg/dL — ABNORMAL HIGH (ref 6–20)
CO2: 24 mmol/L (ref 22–32)
Calcium: 8.5 mg/dL — ABNORMAL LOW (ref 8.9–10.3)
Chloride: 99 mmol/L (ref 98–111)
Creatinine, Ser: 2.88 mg/dL — ABNORMAL HIGH (ref 0.61–1.24)
GFR, Estimated: 25 mL/min — ABNORMAL LOW (ref >60)
Glucose, Bld: 109 mg/dL — ABNORMAL HIGH (ref 70–99)
Potassium: 4.2 mmol/L (ref 3.5–5.1)
Sodium: 134 mmol/L — ABNORMAL LOW (ref 135–145)

## 2020-12-17 LAB — GLUCOSE, CAPILLARY
Glucose-Capillary: 96 mg/dL (ref 70–99)
Glucose-Capillary: 85 mg/dL (ref 70–99)

## 2020-12-17 MED ORDER — BLOOD GLUCOSE METER KIT: PACK | 0 refills | Status: AC

## 2020-12-17 MED ORDER — DIGOXIN 125 MCG PO TABS
125.0000 ug | ORAL_TABLET | Freq: Every day | ORAL | Status: DC
  Administered 2020-12-17: 125 ug via ORAL
  Filled 2020-12-17: qty 1

## 2020-12-17 MED ORDER — LOSARTAN POTASSIUM 50 MG PO TABS
50.0000 mg | ORAL_TABLET | Freq: Every day | ORAL | Status: DC
  Administered 2020-12-17: 50 mg via ORAL
  Filled 2020-12-17: qty 1

## 2020-12-17 MED ORDER — HUMALOG KWIKPEN 100 UNIT/ML ~~LOC~~ SOPN: 2.0000 [IU] | PEN_INJECTOR | Freq: Two times a day (BID) | SUBCUTANEOUS | 11 refills | Status: AC

## 2020-12-17 MED ORDER — LANTUS SOLOSTAR 100 UNIT/ML ~~LOC~~ SOPN: 25.0000 [IU] | PEN_INJECTOR | Freq: Two times a day (BID) | SUBCUTANEOUS | 11 refills | Status: AC

## 2020-12-17 MED ORDER — ZOLPIDEM TARTRATE 5 MG PO TABS: 10.0000 mg | ORAL_TABLET | Freq: Every evening | ORAL | Status: DC | PRN

## 2020-12-17 MED ORDER — BUMETANIDE 2 MG PO TABS
2.0000 mg | ORAL_TABLET | Freq: Two times a day (BID) | ORAL | Status: DC
  Administered 2020-12-17: 2 mg via ORAL
  Filled 2020-12-17 (×2): qty 1

## 2020-12-17 NOTE — Plan of Care (Signed)
  Problem: Education: Goal: Knowledge of General Education information will improve Description: Including pain rating scale, medication(s)/side effects and non-pharmacologic comfort measures Outcome: Completed/Met

## 2020-12-17 NOTE — TOC Transition Note (Addendum)
Transition of Care [facility]) - CM/SW Discharge Note   Patient Details  Name: Kent Russell MRN: [id number] Date of Birth: 1966-01-09  [facility]) CM/SW Contact:  Carles Collet, RN Phone Number: 12/17/2020, 10:16 AM   Clinical Narrative:    DME ordered through [employer], address verified for delivery of rollator. Patient notified that 3/1 will be delivered to room, and rollator will be shipped to fiance's home address.  Dr Eliseo Squires, [facility] Director on call agreed to have [facility] serve as PCP for [place]. Referral sent to [facility], pending approval.  [facility] accepted for [facility]     Final next level of care: [place] Barriers to Discharge: No Barriers Identified   Patient Goals and CMS Choice Patient states their goals for this hospitalization and ongoing recovery are:: will stay at fiance's house until string enough to return to [facility].gov Compare Post Acute Care list provided to:: Patient Choice offered to / list presented to : Patient  Discharge Placement                       Discharge Plan and Services In-house Referral: Clinical Social Work   Post Acute Care Choice: Home Health          DME Arranged: 3-N-1,Walker rolling with seat DME Agency: [employer] Date DME Agency Contacted: 12/17/20 Time DME Agency Contacted: [id number] Representative spoke with at DME Agency: Jodell [employer] [place]: PT,OT [place] Agency: [facility] (formerly [employer]) Date [place]: 12/17/20 Time [place]: [place] Representative spoke with at [place]: [place]  Social Determinants of Health ([place]) Interventions     Readmission Risk Interventions No flowsheet data found.

## 2020-12-17 NOTE — Discharge Summary (Signed)
Physician Discharge Summary  Kent Russell OEU:[id number] DOB: July 19, 1966 DOA: 12/14/2020  PCP: Patient, No Pcp Per  Admit date: 12/14/2020 Discharge date: 12/17/2020  Admitted From: Home Disposition:  Home  Recommendations for Outpatient Follow-up:  1. Follow up with Cardiology in 1-2 weeks in [place] 2. Please obtain BMP/CBC in one week   Home Health:Yes Equipment/Devices:None  Discharge Condition:Stable CODE STATUS:Full Diet recommendation: Heart Healthy  Brief/Interim Summary: 55 y.o. male past medical history significant for CAD status post stenting, chronic systolic heart failure secondary to ischemic cardiomyopathy with an EF of 45%, chronic kidney disease stage IV, essential hypertension insulin-dependent diabetes mellitus chronic iron deficiency anemia, recently admitted to [facility] about 2 weeks prior to admission for acute kidney injury due to acute urinary retention a Foley was placed, started having bilateral lower extremity weakness and wobbly gait associated with   Discharge Diagnoses:  Principal Problem:   Orthostatic hypotension Active Problems:   CVA (cerebral vascular accident) ([place])   Diabetes mellitus type 2, insulin dependent ([place])   AKI (acute kidney injury) ([place])   CKD (chronic kidney disease), stage III ([place])   Iron deficiency anemia due to chronic blood loss   Chronic systolic CHF (congestive heart failure) ([place])   Essential hypertension  Lightheadedness due to orthostatic hypotension: He relates nicely dizziness upon standing orthostatics were positive diuretics were held, he relates his medications were changed during his recent admission to [facility], they added Aldactone. Diuretics and ACE inhibitor were held due to acute kidney injury. He was started on IV fluids his dizziness improved. MRI of the brain showed no acute findings, neurology amended to continue Lipitor, aspirin and Plavix. His antihypertensive medications were held and his  dizziness improved significantly. He will be resumed as an outpatient.  Acute kidney injury on chronic kidney disease stage IIIb: With a baseline creatinine around 2.5 on admission 3.2 he was given gentle hydration for 24 hours his diuretics were held and his creatinine returned to baseline.  Chronic systolic heart failure secondary to ischemic cardiomyopathy: Diuretics were held his creatinine improved his ARB, hydralazine and Imdur were resumed his blood pressure improved. He was definitely hypovolemic on admission. His Bumex was resumed 24 hours prior to admission and his blood pressure remained stable.  He will continue these as an outpatient. We have discontinue the Aldactone, which will be restarted as an outpatient by his cardiologist.  Tremors: Likely due to Lyrica this was discontinued.  Chronic iron deficiency anemia: Hemoglobin is stable continue ferrous sulfate as an outpatient.  Essential hypertension: Continue metoprolol, hydralazine and Imdur and losartan. His Aldactone was discontinued.  He will be reevaluated as an outpatient by his cardiologist and restarted as an outpatient.  Insulin-dependent diabetes mellitus type 2: No change made to his medication his A1c was 7.1.  History of cervical myelopathy: Follow-up with an [facility] PCP as an outpatient.  Hypokalemia: This was repleted orally now resolved.  Discharge Instructions  Discharge Instructions    Diet - low sodium heart healthy   Complete by: As directed    Increase activity slowly   Complete by: As directed      Allergies as of 12/17/2020   No Known Allergies     Medication List    STOP taking these medications   lactulose 10 GM/15ML solution Commonly known as: CHRONULAC   pregabalin 50 MG capsule Commonly known as: LYRICA   spironolactone 25 MG tablet Commonly known as: ALDACTONE     TAKE these medications   ascorbic  acid 250 MG tablet Commonly known as: VITAMIN C Take 250 mg by mouth  daily.   aspirin 81 MG EC tablet Take 81 mg by mouth daily.   atorvastatin 80 MG tablet Commonly known as: LIPITOR Take 80 mg by mouth daily.   bisacodyl 5 MG EC tablet Commonly known as: DULCOLAX Take 5 mg by mouth daily.   blood glucose meter kit and supplies Dispense based on patient and insurance preference. Use up to four times daily as directed. (FOR ICD-10 E10.9, E11.9).   bumetanide 2 MG tablet Commonly known as: BUMEX Take 2 mg by mouth 2 (two) times daily.   calcium-vitamin D 500-200 MG-UNIT tablet Commonly known as: OSCAL WITH D Take 1 tablet by mouth daily with breakfast.   capsaicin 0.025 % cream Commonly known as: ZOSTRIX Apply 1 application topically 3 (three) times daily as needed for pain.   clopidogrel 75 MG tablet Commonly known as: PLAVIX Take 75 mg by mouth daily.   digoxin 0.125 MG tablet Commonly known as: LANOXIN Take 125 mcg by mouth daily.   DULoxetine 30 MG capsule Commonly known as: CYMBALTA Take 30 mg by mouth daily.   ferrous sulfate 325 (65 FE) MG EC tablet Take 325 mg by mouth daily.   HumaLOG KwikPen 100 UNIT/ML KwikPen Generic drug: insulin lispro Inject 2-10 Units into the skin in the morning and at bedtime. Sliding scales   icosapent Ethyl 1 g capsule Commonly known as: VASCEPA Take 2 g by mouth 2 (two) times daily.   isosorbide mononitrate 30 MG 24 hr tablet Commonly known as: IMDUR Take 30 mg by mouth daily.   Lantus SoloStar 100 UNIT/ML Solostar Pen Generic drug: insulin glargine Inject 25 Units into the skin 2 (two) times daily.   levothyroxine 88 MCG tablet Commonly known as: SYNTHROID Take 88 mcg by mouth daily.   losartan 50 MG tablet Commonly known as: COZAAR Take 50 mg by mouth daily.   melatonin 3 MG Tabs tablet Take 3 mg by mouth at bedtime as needed for sleep.   metoprolol 200 MG 24 hr tablet Commonly known as: TOPROL-XL Take 200 mg by mouth 2 (two) times daily.   nitroGLYCERIN 0.4 MG SL  tablet Commonly known as: NITROSTAT Place 0.4 mg under the tongue every 5 (five) minutes as needed for chest pain (max 3 doses).   pantoprazole 40 MG tablet Commonly known as: PROTONIX Take 40 mg by mouth daily.   polyethylene glycol powder 17 GM/SCOOP powder Commonly known as: GLYCOLAX/MIRALAX Take 17 g by mouth daily as needed for constipation.   Quintabs Tabs Take 1 tablet by mouth daily.   ranolazine 1000 MG SR tablet Commonly known as: RANEXA Take 1,000 mg by mouth 2 (two) times daily.   tamsulosin 0.4 MG Caps capsule Commonly known as: FLOMAX Take 0.4 mg by mouth daily.   Trulicity 1.5 MG/0.5ML Sopn Generic drug: Dulaglutide Inject 1.5 mg into the skin every Saturday.   zolpidem 10 MG tablet Commonly known as: AMBIEN Take 10 mg by mouth at bedtime as needed for sleep.       No Known Allergies  Consultations:  Neurology   Procedures/Studies: MR ANGIO HEAD WO CONTRAST  Result Date: 12/14/2020 CLINICAL DATA:  Initial evaluation for neuro deficit, stroke suspected. Weakness, speech abnormality. EXAM: MRI HEAD WITHOUT CONTRAST MRA HEAD WITHOUT CONTRAST TECHNIQUE: Multiplanar, multiecho pulse sequences of the brain and surrounding structures were obtained without intravenous contrast. Angiographic images of the head were obtained using MRA technique without contrast.  COMPARISON:  Prior head CT from earlier the same day as well as recent brain MRI from 11/16/2020. FINDINGS: MRI HEAD FINDINGS Brain: Examination degraded by motion artifact. Cerebral volume within normal limits for age. Patchy T2/FLAIR hyperintensity within the periventricular and deep white matter both cerebral hemispheres most consistent with chronic small vessel ischemic disease, mild in nature. No abnormal foci of restricted diffusion to suggest acute or subacute ischemia. Gray-white matter differentiation maintained. No encephalomalacia to suggest chronic cortical infarction. No foci of susceptibility  artifact to suggest acute or chronic intracranial hemorrhage. No mass lesion, midline shift or mass effect. No hydrocephalus or extra-axial fluid collection. Pituitary gland suprasellar region normal. Midline structures intact. Vascular: Major intracranial vascular flow voids are maintained. Skull and upper cervical spine: Craniocervical junction within normal limits. Bone marrow signal intensity normal. No scalp soft tissue abnormality. Sinuses/Orbits: Globes and orbital soft tissues within normal limits. Mild scattered mucosal thickening noted within the ethmoidal air cells and maxillary sinuses. Paranasal sinuses are otherwise largely clear. Trace fluid signal intensity noted within the right mastoid air cells, of doubtful significance. Inner ear structures grossly normal. Other: None. MRA HEAD FINDINGS ANTERIOR CIRCULATION: Examination degraded by motion artifact. Visualized distal cervical segments of the internal carotid arteries are patent with antegrade flow. Petrous, cavernous, and supraclinoid segments patent without stenosis or other abnormality. A1 segments patent bilaterally. Grossly normal anterior communicating artery complex. Anterior cerebral arteries patent to their distal aspects without stenosis. No M1 stenosis or occlusion. Grossly normal MCA bifurcations. Distal MCA branches well perfused and symmetric. POSTERIOR CIRCULATION: Visualized V4 segments widely patent to the vertebrobasilar junction. Both PICA origins patent and normal. Basilar patent to its distal aspect without stenosis. Superior cerebellar arteries patent bilaterally. Right PCA primarily supplied via the basilar, although a small right posterior communicating arteries noted. Hypoplastic left P1 segment and robust left posterior communicating artery supplies the left PCA. Both PCAs well perfused to their distal aspects. No intracranial aneurysm. IMPRESSION: MRI HEAD IMPRESSION: 1. Motion degraded exam. 2. No acute intracranial  infarct or other abnormality. 3. Mild chronic microvascular ischemic disease for age. MRA HEAD IMPRESSION: 1. Technically limited exam due to motion artifact. 2. Grossly negative intracranial MRA. No large vessel occlusion, hemodynamically significant stenosis, or other acute vascular abnormality. Electronically Signed   By: Rise Mu M.D.   On: 12/14/2020 18:06   MR BRAIN WO CONTRAST  Result Date: 12/14/2020 CLINICAL DATA:  Initial evaluation for neuro deficit, stroke suspected. Weakness, speech abnormality. EXAM: MRI HEAD WITHOUT CONTRAST MRA HEAD WITHOUT CONTRAST TECHNIQUE: Multiplanar, multiecho pulse sequences of the brain and surrounding structures were obtained without intravenous contrast. Angiographic images of the head were obtained using MRA technique without contrast. COMPARISON:  Prior head CT from earlier the same day as well as recent brain MRI from 11/16/2020. FINDINGS: MRI HEAD FINDINGS Brain: Examination degraded by motion artifact. Cerebral volume within normal limits for age. Patchy T2/FLAIR hyperintensity within the periventricular and deep white matter both cerebral hemispheres most consistent with chronic small vessel ischemic disease, mild in nature. No abnormal foci of restricted diffusion to suggest acute or subacute ischemia. Gray-white matter differentiation maintained. No encephalomalacia to suggest chronic cortical infarction. No foci of susceptibility artifact to suggest acute or chronic intracranial hemorrhage. No mass lesion, midline shift or mass effect. No hydrocephalus or extra-axial fluid collection. Pituitary gland suprasellar region normal. Midline structures intact. Vascular: Major intracranial vascular flow voids are maintained. Skull and upper cervical spine: Craniocervical junction within normal limits. Bone marrow signal intensity  normal. No scalp soft tissue abnormality. Sinuses/Orbits: Globes and orbital soft tissues within normal limits. Mild scattered  mucosal thickening noted within the ethmoidal air cells and maxillary sinuses. Paranasal sinuses are otherwise largely clear. Trace fluid signal intensity noted within the right mastoid air cells, of doubtful significance. Inner ear structures grossly normal. Other: None. MRA HEAD FINDINGS ANTERIOR CIRCULATION: Examination degraded by motion artifact. Visualized distal cervical segments of the internal carotid arteries are patent with antegrade flow. Petrous, cavernous, and supraclinoid segments patent without stenosis or other abnormality. A1 segments patent bilaterally. Grossly normal anterior communicating artery complex. Anterior cerebral arteries patent to their distal aspects without stenosis. No M1 stenosis or occlusion. Grossly normal MCA bifurcations. Distal MCA branches well perfused and symmetric. POSTERIOR CIRCULATION: Visualized V4 segments widely patent to the vertebrobasilar junction. Both PICA origins patent and normal. Basilar patent to its distal aspect without stenosis. Superior cerebellar arteries patent bilaterally. Right PCA primarily supplied via the basilar, although a small right posterior communicating arteries noted. Hypoplastic left P1 segment and robust left posterior communicating artery supplies the left PCA. Both PCAs well perfused to their distal aspects. No intracranial aneurysm. IMPRESSION: MRI HEAD IMPRESSION: 1. Motion degraded exam. 2. No acute intracranial infarct or other abnormality. 3. Mild chronic microvascular ischemic disease for age. MRA HEAD IMPRESSION: 1. Technically limited exam due to motion artifact. 2. Grossly negative intracranial MRA. No large vessel occlusion, hemodynamically significant stenosis, or other acute vascular abnormality. Electronically Signed   By: Jeannine Boga M.D.   On: 12/14/2020 18:06   ECHOCARDIOGRAM COMPLETE  Result Date: 12/15/2020    ECHOCARDIOGRAM REPORT   Patient Name:   Kent Russell Date of Exam: 12/15/2020 Medical Rec #:   [id number]      Height:       68.0 in Accession #:    [id number]     Weight:       186.9 lb Date of Birth:  17-May-1966      BSA:          1.986 m Patient Age:    70 years       BP:           158/78 mmHg Patient Gender: M              HR:           72 bpm. Exam Location:  Inpatient Procedure: 2D Echo, Cardiac Doppler, Color Doppler and Intracardiac            Opacification Agent                                 MODIFIED REPORT:     This report was modified by Lyman Bishop MD on 12/15/2020 due to Updated               comparison with recent echo in 11/2020 from [place].  Indications:     TIA  History:         Patient has no prior history of Echocardiogram examinations.                  CHF; Risk Factors:Hypertension. Ischemic cardiomyopathy. CKD.  Sonographer:     Clayton Lefort RDCS (AE) Referring Phys:  [id number] Lequita Halt Diagnosing Phys: Lyman Bishop MD IMPRESSIONS  1. No LV apical thrombus. Left ventricular ejection fraction, by estimation, is 50 to 55%. The left ventricle has low normal function. The left ventricle  demonstrates regional wall motion abnormalities (see scoring diagram/findings for description). There is moderate left ventricular hypertrophy. Left ventricular diastolic parameters are consistent with Grade II diastolic dysfunction (pseudonormalization). Elevated left ventricular end-diastolic pressure. There is moderate hypokinesis of the left ventricular, basal inferior wall and inferolateral wall.  2. Right ventricular systolic function is normal. The right ventricular size is normal. There is moderately elevated pulmonary artery systolic pressure. The estimated right ventricular systolic pressure is 58.1 mmHg.  3. Left atrial size was moderately dilated.  4. The mitral valve is abnormal. Mild mitral valve regurgitation.  5. The aortic valve is tricuspid. Aortic valve regurgitation is not visualized. Mild aortic valve sclerosis is present, with no evidence of aortic valve stenosis. Aortic valve mean  gradient measures 5.0 mmHg.  6. Aortic dilatation noted. There is mild dilatation of the aortic root, measuring 40 mm. There is borderline dilatation of the ascending aorta, measuring 38 mm.  7. The inferior vena cava is dilated in size with >50% respiratory variability, suggesting right atrial pressure of 8 mmHg. Comparison(s): Prior images unable to be directly viewed, comparison made by report only. Echo from 11/13/2020 [facility]): LVEF 50-55%, inferolateral infarct. Conclusion(s)/Recommendation(s): No intracardiac source of embolism detected on this transthoracic study. A transesophageal echocardiogram is recommended to exclude cardiac source of embolism if clinically indicated. FINDINGS  Left Ventricle: No LV apical thrombus. Left ventricular ejection fraction, by estimation, is 50 to 55%. The left ventricle has low normal function. The left ventricle demonstrates regional wall motion abnormalities. Moderate hypokinesis of the left ventricular, basal inferior wall and inferolateral wall. Definity contrast agent was given IV to delineate the left ventricular endocardial borders. The left ventricular internal cavity size was normal in size. There is moderate left ventricular hypertrophy. Left ventricular diastolic parameters are consistent with Grade II diastolic dysfunction (pseudonormalization). Elevated left ventricular end-diastolic pressure. Right Ventricle: The right ventricular size is normal. No increase in right ventricular wall thickness. Right ventricular systolic function is normal. There is moderately elevated pulmonary artery systolic pressure. The tricuspid regurgitant velocity is 3.54 m/s, and with an assumed right atrial pressure of 8 mmHg, the estimated right ventricular systolic pressure is 58.1 mmHg. Left Atrium: Left atrial size was moderately dilated. Right Atrium: Right atrial size was normal in size. Pericardium: There is no evidence of pericardial effusion. Mitral Valve: The mitral valve is  abnormal. There is mild thickening of the mitral valve leaflet(s). Mild mitral valve regurgitation. MV peak gradient, 7.7 mmHg. The mean mitral valve gradient is 3.0 mmHg. Tricuspid Valve: The tricuspid valve is grossly normal. Tricuspid valve regurgitation is mild. Aortic Valve: The aortic valve is tricuspid. Aortic valve regurgitation is not visualized. Mild aortic valve sclerosis is present, with no evidence of aortic valve stenosis. Aortic valve mean gradient measures 5.0 mmHg. Aortic valve peak gradient measures 8.4 mmHg. Aortic valve area, by VTI measures 3.11 cm. Pulmonic Valve: The pulmonic valve was grossly normal. Pulmonic valve regurgitation is not visualized. Aorta: Aortic dilatation noted. There is mild dilatation of the aortic root, measuring 40 mm. There is borderline dilatation of the ascending aorta, measuring 38 mm. Venous: The inferior vena cava is dilated in size with greater than 50% respiratory variability, suggesting right atrial pressure of 8 mmHg. IAS/Shunts: No atrial level shunt detected by color flow Doppler.  LEFT VENTRICLE PLAX 2D LVIDd:         5.80 cm  Diastology LVIDs:         4.40 cm  LV e' medial:    5.33  cm/s LV PW:         1.50 cm  LV E/e' medial:  28.9 LV IVS:        1.30 cm  LV e' lateral:   8.55 cm/s LVOT diam:     2.60 cm  LV E/e' lateral: 18.0 LV SV:         89 LV SV Index:   45 LVOT Area:     5.31 cm  RIGHT VENTRICLE             IVC RV Basal diam:  3.50 cm     IVC diam: 2.10 cm RV Mid diam:    3.30 cm RV S prime:     12.00 cm/s TAPSE (M-mode): 1.4 cm LEFT ATRIUM             Index       RIGHT ATRIUM           Index LA diam:        4.10 cm 2.06 cm/m  RA Area:     17.70 cm LA Vol (A2C):   81.7 ml 41.14 ml/m RA Volume:   51.90 ml  26.13 ml/m LA Vol (A4C):   81.9 ml 41.24 ml/m LA Biplane Vol: 83.9 ml 42.25 ml/m  AORTIC VALVE AV Area (Vmax):    3.23 cm AV Area (Vmean):   2.86 cm AV Area (VTI):     3.11 cm AV Vmax:           145.00 cm/s AV Vmean:          105.000 cm/s  AV VTI:            0.287 m AV Peak Grad:      8.4 mmHg AV Mean Grad:      5.0 mmHg LVOT Vmax:         88.10 cm/s LVOT Vmean:        56.500 cm/s LVOT VTI:          0.168 m LVOT/AV VTI ratio: 0.59  AORTA Ao Root diam: 4.00 cm Ao Asc diam:  3.80 cm MITRAL VALVE                TRICUSPID VALVE MV Area (PHT): 3.77 cm     TR Peak grad:   50.1 mmHg MV Area VTI:   2.11 cm     TR Vmax:        354.00 cm/s MV Peak grad:  7.7 mmHg MV Mean grad:  3.0 mmHg     SHUNTS MV Vmax:       1.39 m/s     Systemic VTI:  0.17 m MV Vmean:      82.7 cm/s    Systemic Diam: 2.60 cm MV Decel Time: 201 msec MR Peak grad: 127.7 mmHg MR Mean grad: 82.0 mmHg MR Vmax:      565.00 cm/s MR Vmean:     430.0 cm/s MV E velocity: 154.00 cm/s MV A velocity: 120.00 cm/s MV E/A ratio:  1.28 Lyman Bishop MD Electronically signed by Lyman Bishop MD Signature Date/Time: 12/15/2020/2:56:40 PM    Final (Updated)    CT HEAD CODE STROKE WO CONTRAST  Result Date: 12/14/2020 CLINICAL DATA:  Code stroke.  Left-sided weakness EXAM: CT HEAD WITHOUT CONTRAST TECHNIQUE: Contiguous axial images were obtained from the base of the skull through the vertex without intravenous contrast. COMPARISON:  11/13/2020 FINDINGS: Brain: No acute intracranial hemorrhage, mass effect, or edema. Gray-white differentiation is preserved. There is no extra-axial collection.  Ventricles and sulci are stable in size and configuration. Vascular: No hyperdense vessel. There is intracranial atherosclerotic calcification at the skull base. Skull: Unremarkable. Sinuses/Orbits: Polypoid soft tissue within the nasal cavity. Mild paranasal sinus mucosal thickening. Orbits are unremarkable. Other: Mastoid air cells are clear. ASPECTS ([place] Stroke Program Early CT Score) - Ganglionic level infarction (caudate, lentiform nuclei, internal capsule, insula, M1-M3 cortex): 7 - Supraganglionic infarction (M4-M6 cortex): 3 Total score (0-10 with 10 being normal): 10 IMPRESSION: There is no acute  intracranial hemorrhage or evidence of acute infarction. ASPECT score is 10. Probable nasal polyposis. These results were communicated to Dr. Cheral Marker at 3:10 pm on 12/14/2020 by text page via the [facility] messaging system. Electronically Signed   By: Macy Mis M.D.   On: 12/14/2020 15:17     Subjective: No new complaints  Discharge Exam: Vitals:   12/16/20 2152 12/17/20 0444  BP: 138/69 (!) 162/77  Pulse: 74 75  Resp:  17  Temp:  98.5 F (36.9 C)  SpO2: 97% 94%   Vitals:   12/16/20 1523 12/16/20 2030 12/16/20 2152 12/17/20 0444  BP: 120/67 (!) 145/74 138/69 (!) 162/77  Pulse: 70 71 74 75  Resp: _0 Temp: 98.1 F (36.7 C) 98.3 F (36.8 C)  98.5 F (36.9 C)  TempSrc: Oral Oral  Oral  SpO2: 98% 95% 97% 94%  Weight:      Height:        General: Pt is alert, awake, not in acute distress Cardiovascular: RRR, S1/S2 +, no rubs, no gallops Respiratory: CTA bilaterally, no wheezing, no rhonchi Abdominal: Soft, NT, ND, bowel sounds + Extremities: no edema, no cyanosis    The results of significant diagnostics from this hospitalization (including imaging, microbiology, ancillary and laboratory) are listed below for reference.     Microbiology: Recent Results (from the past 240 hour(s))  SARS CORONAVIRUS 2 (TAT 6-24 HRS) Nasopharyngeal Nasopharyngeal Swab     Status: None   Collection Time: 12/15/20  3:55 AM   Specimen: Nasopharyngeal Swab  Result Value Ref Range Status   SARS Coronavirus 2 NEGATIVE NEGATIVE Final    Comment: (NOTE) SARS-CoV-2 target nucleic acids are NOT DETECTED.  The SARS-CoV-2 RNA is generally detectable in upper and lower respiratory specimens during the acute phase of infection. Negative results do not preclude SARS-CoV-2 infection, do not rule out co-infections with other pathogens, and should not be used as the sole basis for treatment or other patient management decisions. Negative results must be combined with clinical  observations, patient history, and epidemiological information. The expected result is Negative.  Fact Sheet for Patients: SugarRoll.be  Fact Sheet for Healthcare Providers: https://www.woods-mathews.com/  This test is not yet approved or cleared by the [place] FDA and  has been authorized for detection and/or diagnosis of SARS-CoV-2 by FDA under an Emergency Use Authorization (EUA). This EUA will remain  in effect (meaning this test can be used) for the duration of the COVID-19 declaration under Se ction 564(b)(1) of the Act, 21 U.S.C. section 360bbb-3(b)(1), unless the authorization is terminated or revoked sooner.  Performed at [facility], [place] [address], [place], [place] [address]      Labs: BNP (last 3 results) No results for input(s): BNP in the last 8760 hours. Basic Metabolic Panel: Recent Labs  Lab 12/14/20 1502 12/14/20 1511 12/15/20 0228 12/16/20 0537 12/17/20 0414  NA 133* 134*  --  135 134*  K 4.0 3.9  --  3.4* 4.2  CL 96* 93*  --  99 99  CO2 27  --   --  27 24  GLUCOSE 228* 229*  --  130* 109*  BUN 37* 36*  --  31* 33*  CREATININE 3.23* 3.10*  --  2.88* 2.88*  CALCIUM 8.6*  --  9.5 8.9 8.5*   Liver Function Tests: Recent Labs  Lab 12/14/20 1502  AST 16  ALT 15  ALKPHOS 36*  BILITOT 0.4  PROT 5.6*  ALBUMIN 2.7*   No results for input(s): LIPASE, AMYLASE in the last 168 hours. Recent Labs  Lab 12/14/20 1836  AMMONIA 15   CBC: Recent Labs  Lab 12/14/20 1502 12/14/20 1511  WBC 10.1  --   NEUTROABS 8.3*  --   HGB 7.9* 8.5*  HCT 25.0* 25.0*  MCV 92.9  --   PLT 264  --    Cardiac Enzymes: No results for input(s): CKTOTAL, CKMB, CKMBINDEX, TROPONINI in the last 168 hours. BNP: Invalid input(s): POCBNP CBG: Recent Labs  Lab 12/16/20 0354 12/16/20 1157 12/16/20 1711 12/16/20 2040 12/17/20 0626  GLUCAP 113* 183* 107* 173* 96   D-Dimer No results for input(s): DDIMER in the  last 72 hours. Hgb A1c Recent Labs    12/15/20 0228  HGBA1C 7.1*   Lipid Profile Recent Labs    12/15/20 0228  CHOL 94  HDL 19*  LDLCALC 38  TRIG 011*  CHOLHDL 4.9   Thyroid function studies Recent Labs    12/14/20 1836  TSH 3.313   Anemia work up Recent Labs    12/14/20 1836  FERRITIN 74  TIBC 248*  IRON 30*  RETICCTPCT 2.6   Urinalysis    Component Value Date/Time   COLORURINE YELLOW 12/15/2020 0034   APPEARANCEUR HAZY (A) 12/15/2020 0034   LABSPEC 1.010 12/15/2020 0034   PHURINE 6.0 12/15/2020 0034   GLUCOSEU NEGATIVE 12/15/2020 0034   HGBUR MODERATE (A) 12/15/2020 0034   BILIRUBINUR NEGATIVE 12/15/2020 0034   KETONESUR NEGATIVE 12/15/2020 0034   PROTEINUR 100 (A) 12/15/2020 0034   NITRITE NEGATIVE 12/15/2020 0034   LEUKOCYTESUR SMALL (A) 12/15/2020 0034   Sepsis Labs Invalid input(s): PROCALCITONIN,  WBC,  LACTICIDVEN Microbiology Recent Results (from the past 240 hour(s))  SARS CORONAVIRUS 2 (TAT 6-24 HRS) Nasopharyngeal Nasopharyngeal Swab     Status: None   Collection Time: 12/15/20  3:55 AM   Specimen: Nasopharyngeal Swab  Result Value Ref Range Status   SARS Coronavirus 2 NEGATIVE NEGATIVE Final    Comment: (NOTE) SARS-CoV-2 target nucleic acids are NOT DETECTED.  The SARS-CoV-2 RNA is generally detectable in upper and lower respiratory specimens during the acute phase of infection. Negative results do not preclude SARS-CoV-2 infection, do not rule out co-infections with other pathogens, and should not be used as the sole basis for treatment or other patient management decisions. Negative results must be combined with clinical observations, patient history, and epidemiological information. The expected result is Negative.  Fact Sheet for Patients: HairSlick.no  Fact Sheet for Healthcare Providers: quierodirigir.com  This test is not yet approved or cleared by the [place] FDA  and  has been authorized for detection and/or diagnosis of SARS-CoV-2 by FDA under an Emergency Use Authorization (EUA). This EUA will remain  in effect (meaning this test can be used) for the duration of the COVID-19 declaration under Se ction 564(b)(1) of the Act, 21 U.S.C. section 360bbb-3(b)(1), unless the authorization is terminated or revoked sooner.  Performed at [facility], [address], [place], [place] [address]      Time coordinating discharge: Over 40 minutes  SIGNED:   Charlynne Cousins, MD  [facility] 12/17/2020, 8:29 AM Pager   If 7PM-7AM, please contact night-coverage [email or web address] Password [email or web address]

## 2020-12-17 NOTE — Discharge Instructions (Signed)
Kent Russell was admitted to the Hospital on 12/14/2020 and Discharged on Discharge Date 12/17/2020 and should be excused from work/school   for 4  days starting 12/14/2020 , may return to work/school without any restrictions.  Call Bess Harvest MD, [facility] [phone] with questions.  Charlynne Cousins M.D on 12/17/2020,at 1:56 PM  [facility] Office  [phone]

## 2022-10-11 IMAGING — MR MR MRA HEAD W/O CM
2 series · 18 of 48 positions shown · non-contrast
Comparison: Prior head CT from earlier the same day as well as
recent brain MRI from 11/16/2020.

CLINICAL DATA: Initial evaluation for neuro deficit, stroke
suspected. Weakness, speech abnormality.

EXAM:
MRI HEAD WITHOUT CONTRAST
MRA HEAD WITHOUT CONTRAST
TECHNIQUE: Multiplanar, multiecho pulse sequences of the brain and surrounding
structures were obtained without intravenous contrast. Angiographic
images of the head were obtained using MRA technique without
contrast.

[Series 3: ax (id) · axial · 1.0mm · 0.43mm/px · z∈[-72,+11]mm · 17 of 190 slices shown]
[im 1/190]
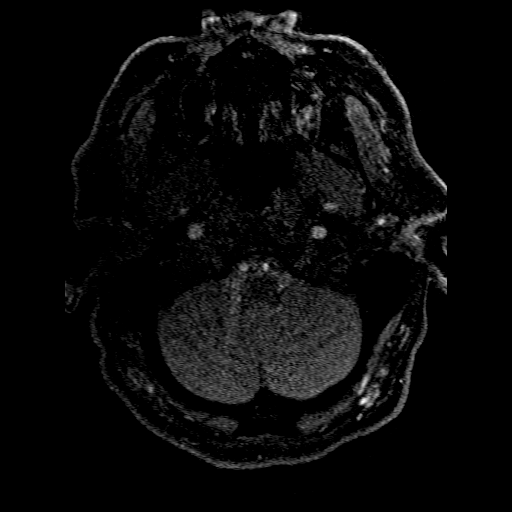
[im 5/190]
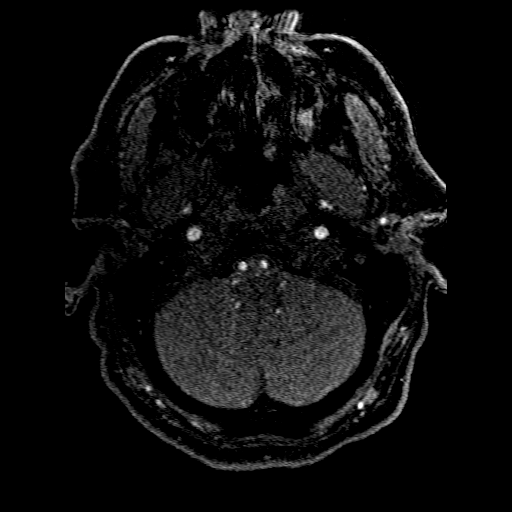
[im 9/190]
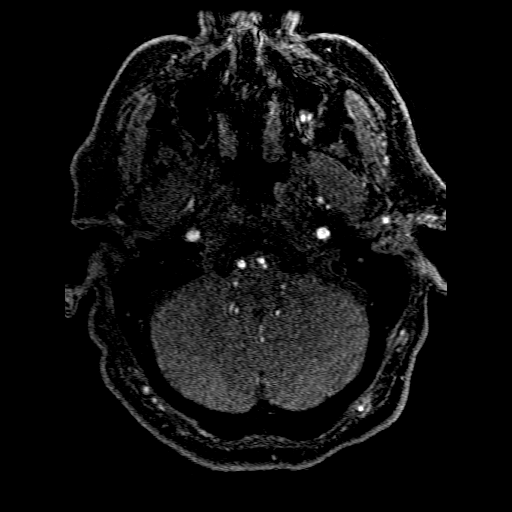
[im 13/190]
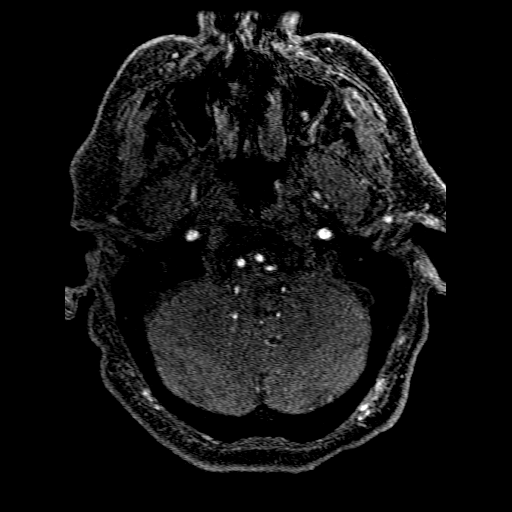
[im 17/190]
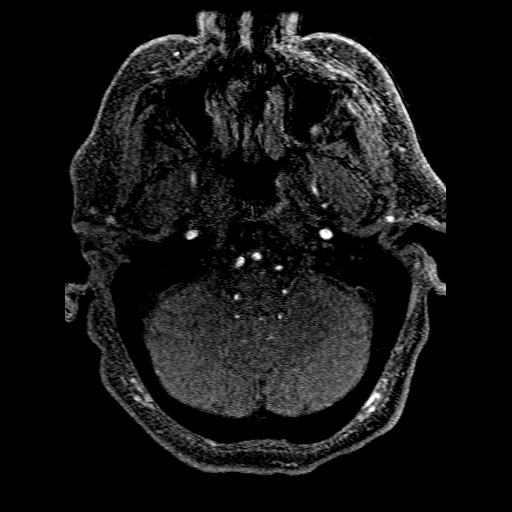
[im 21/190]
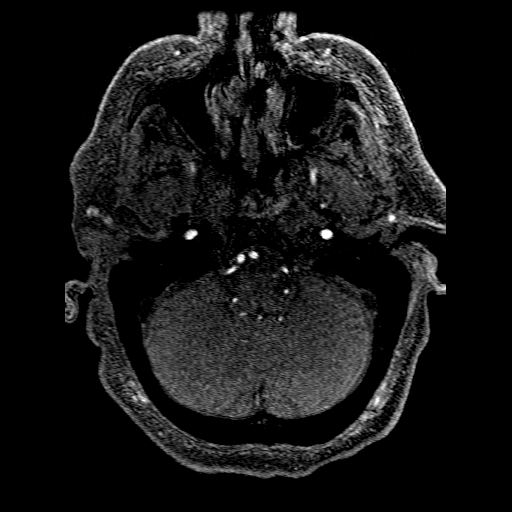
[im 25/190]
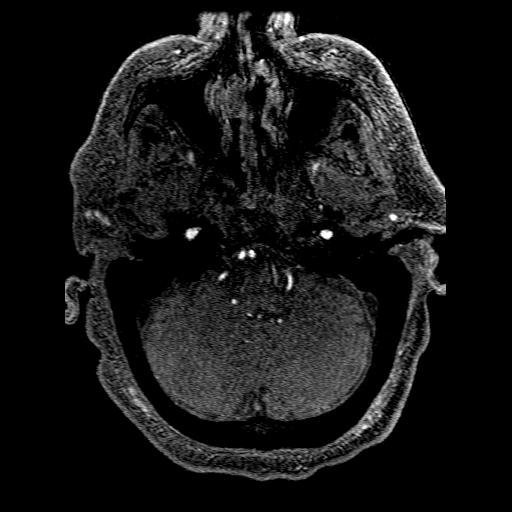
[im 29/190]
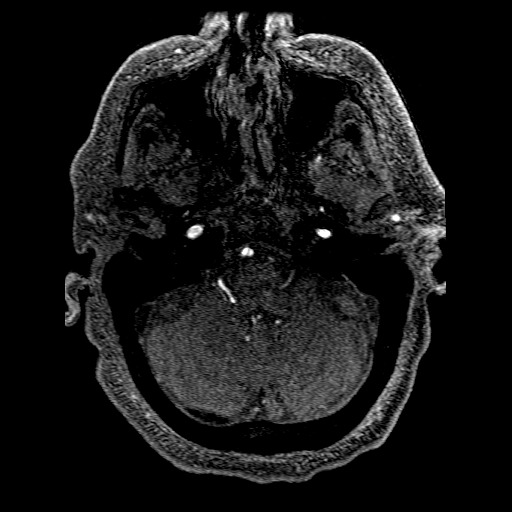
[im 33/190]
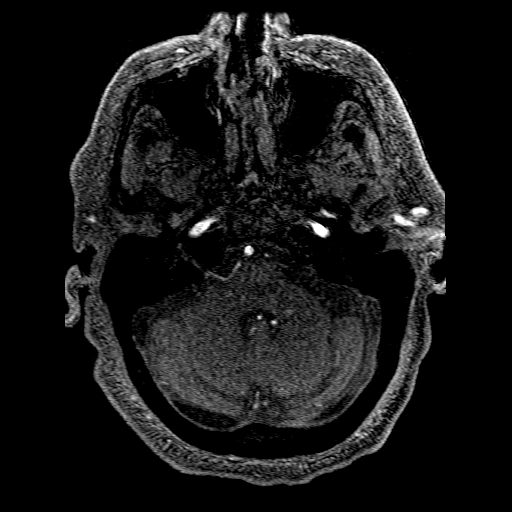
[im 58/190]
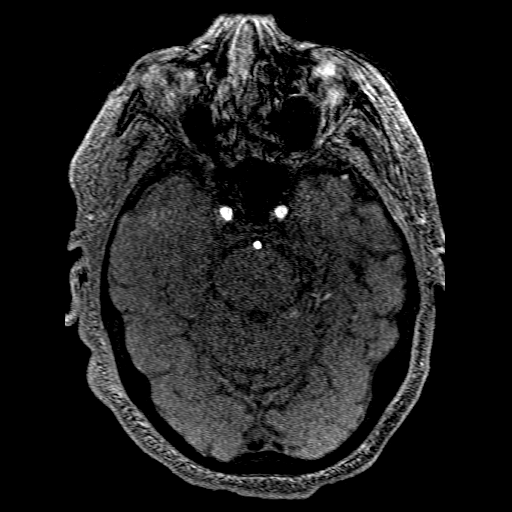
[im 83/190]
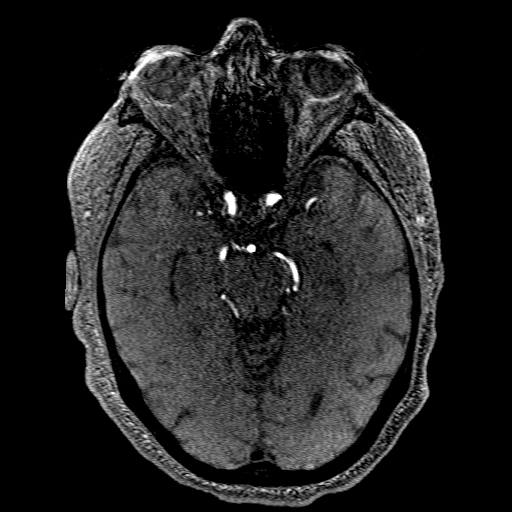
[im 95/190]
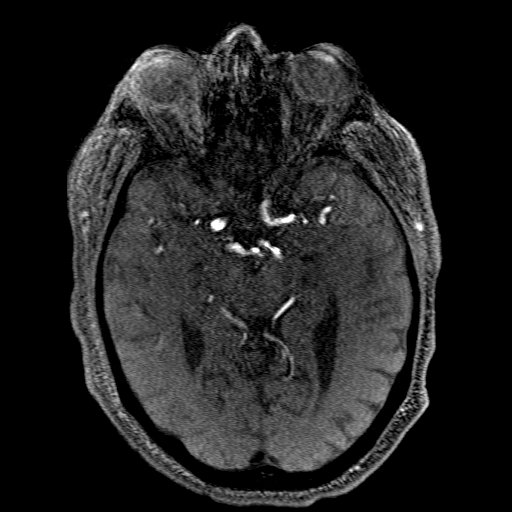
[im 107/190]
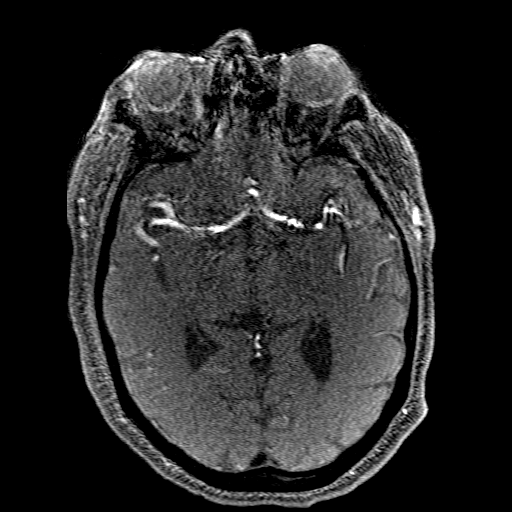
[im 132/190]
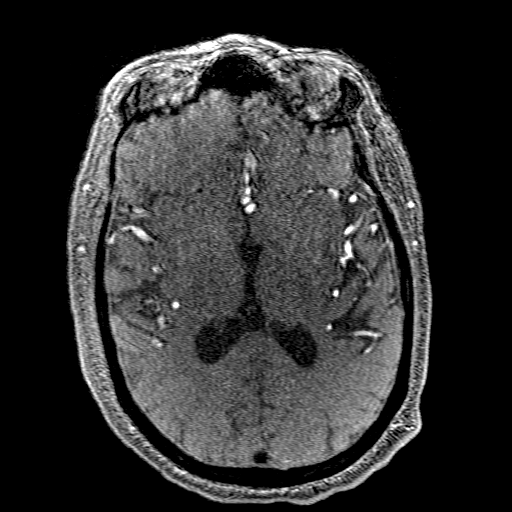
[im 157/190]
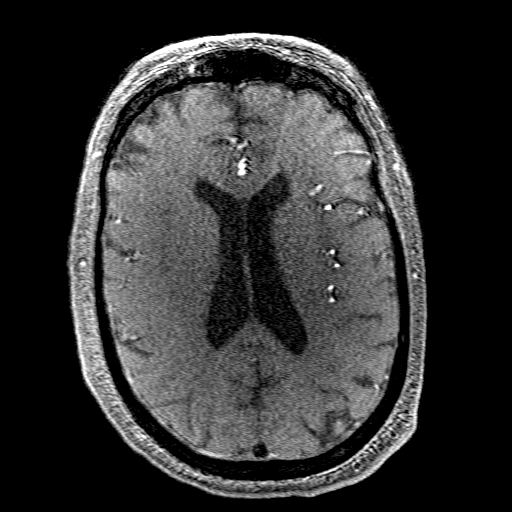
[im 161/190]
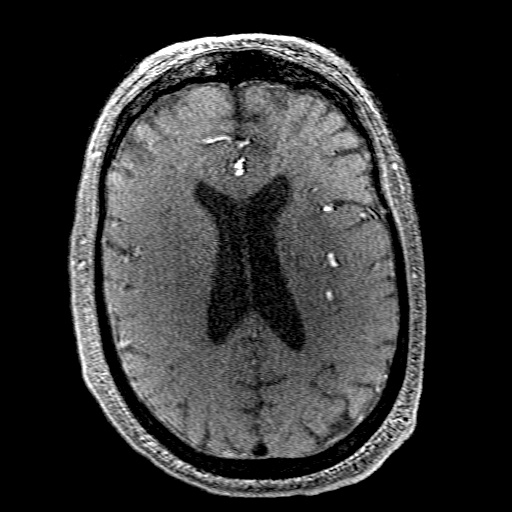
[im 181/190]
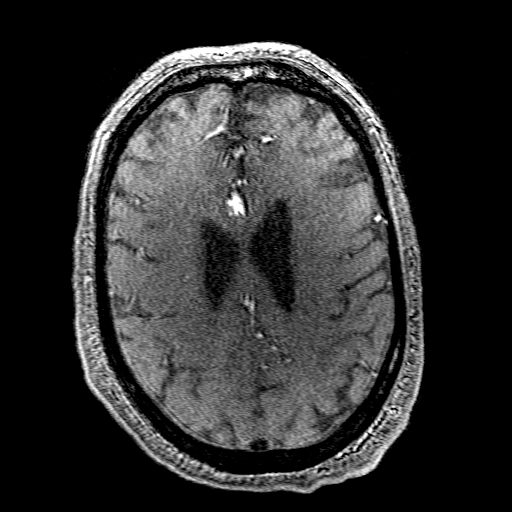

[Series 301: pjn:ax (id) · sagittal · 1.0mm · 0.43mm/px · 1 of 6 slices shown]
[im 1/6]
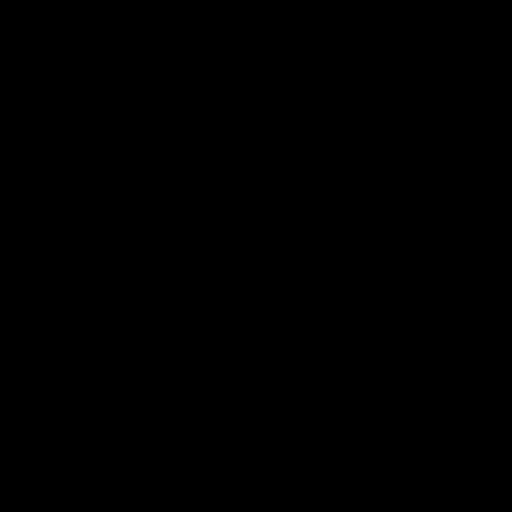

[18 of 48 positions shown; findings below may reference images not displayed]

FINDINGS: MRI HEAD FINDINGS

Brain: Examination degraded by motion artifact.

Cerebral volume within normal limits for age. Patchy T2/FLAIR
hyperintensity within the periventricular and deep white matter both
cerebral hemispheres most consistent with chronic small vessel
ischemic disease, mild in nature.

No abnormal foci of restricted diffusion to suggest acute or
subacute ischemia. Gray-white matter differentiation maintained. No
encephalomalacia to suggest chronic cortical infarction. No foci of
susceptibility artifact to suggest acute or chronic intracranial
hemorrhage.

No mass lesion, midline shift or mass effect. No hydrocephalus or
extra-axial fluid collection. Pituitary gland suprasellar region
normal. Midline structures intact.

Vascular: Major intracranial vascular flow voids are maintained.

Skull and upper cervical spine: Craniocervical junction within
normal limits. Bone marrow signal intensity normal. No scalp soft
tissue abnormality.

Sinuses/Orbits: Globes and orbital soft tissues within normal
limits. Mild scattered mucosal thickening noted within the ethmoidal
air cells and maxillary sinuses. Paranasal sinuses are otherwise
largely clear. Trace fluid signal intensity noted within the right
mastoid air cells, of doubtful significance. Inner ear structures
grossly normal.

Other: None.

MRA HEAD FINDINGS

ANTERIOR CIRCULATION:

Examination degraded by motion artifact.

Visualized distal cervical segments of the internal carotid arteries
are patent with antegrade flow. Petrous, cavernous, and supraclinoid
segments patent without stenosis or other abnormality. A1 segments
patent bilaterally. Grossly normal anterior communicating artery
complex. Anterior cerebral arteries patent to their distal aspects
without stenosis. No M1 stenosis or occlusion. Grossly normal MCA
bifurcations. Distal MCA branches well perfused and symmetric.

POSTERIOR CIRCULATION:

Visualized V4 segments widely patent to the vertebrobasilar
junction. Both PICA origins patent and normal. Basilar patent to its
distal aspect without stenosis. Superior cerebellar arteries patent
bilaterally. Right PCA primarily supplied via the basilar, although
a small right posterior communicating arteries noted. Hypoplastic
left P1 segment and robust left posterior communicating artery
supplies the left PCA. Both PCAs well perfused to their distal
aspects.

No intracranial aneurysm.
IMPRESSION: MRI HEAD IMPRESSION:

1. Motion degraded exam.
2. No acute intracranial infarct or other abnormality.
3. Mild chronic microvascular ischemic disease for age.

MRA HEAD IMPRESSION:

1. Technically limited exam due to motion artifact.
2. Grossly negative intracranial MRA. No large vessel occlusion,
hemodynamically significant stenosis, or other acute vascular
abnormality.
